# Patient Record
Sex: Female | Born: 1940 | Race: Black or African American | Hispanic: No | Smoking: Current every day smoker
Health system: Southern US, Community
[De-identification: ages and names within clinical notes are randomized; demographics above are authoritative.]

## PROBLEM LIST (undated history)

## (undated) DIAGNOSIS — F329 Major depressive disorder, single episode, unspecified: Secondary | ICD-10-CM

## (undated) DIAGNOSIS — Z87440 Personal history of urinary (tract) infections: Secondary | ICD-10-CM

## (undated) DIAGNOSIS — E785 Hyperlipidemia, unspecified: Secondary | ICD-10-CM

## (undated) DIAGNOSIS — F32A Depression, unspecified: Secondary | ICD-10-CM

## (undated) DIAGNOSIS — F509 Eating disorder, unspecified: Secondary | ICD-10-CM

## (undated) DIAGNOSIS — N950 Postmenopausal bleeding: Secondary | ICD-10-CM

## (undated) DIAGNOSIS — B019 Varicella without complication: Secondary | ICD-10-CM

## (undated) DIAGNOSIS — M1990 Unspecified osteoarthritis, unspecified site: Secondary | ICD-10-CM

## (undated) DIAGNOSIS — R519 Headache, unspecified: Secondary | ICD-10-CM

## (undated) DIAGNOSIS — G43909 Migraine, unspecified, not intractable, without status migrainosus: Secondary | ICD-10-CM

## (undated) DIAGNOSIS — I10 Essential (primary) hypertension: Secondary | ICD-10-CM

## (undated) DIAGNOSIS — K5792 Diverticulitis of intestine, part unspecified, without perforation or abscess without bleeding: Secondary | ICD-10-CM

## (undated) DIAGNOSIS — R51 Headache: Secondary | ICD-10-CM

## (undated) DIAGNOSIS — D649 Anemia, unspecified: Secondary | ICD-10-CM

## (undated) HISTORY — DX: Eating disorder, unspecified: F50.9

## (undated) HISTORY — DX: Personal history of urinary (tract) infections: Z87.440

## (undated) HISTORY — PX: ABDOMINAL HYSTERECTOMY: SHX81

## (undated) HISTORY — DX: Unspecified osteoarthritis, unspecified site: M19.90

## (undated) HISTORY — PX: OTHER SURGICAL HISTORY: SHX169

## (undated) HISTORY — DX: Varicella without complication: B01.9

## (undated) HISTORY — DX: Headache: R51

## (undated) HISTORY — DX: Headache, unspecified: R51.9

## (undated) HISTORY — PX: COLONOSCOPY: SHX174

## (undated) HISTORY — PX: EYE SURGERY: SHX253

## (undated) HISTORY — DX: Migraine, unspecified, not intractable, without status migrainosus: G43.909

## (undated) HISTORY — DX: Diverticulitis of intestine, part unspecified, without perforation or abscess without bleeding: K57.92

---

## 2012-06-19 ENCOUNTER — Other Ambulatory Visit (HOSPITAL_COMMUNITY): Payer: Self-pay | Admitting: Pulmonary Disease

## 2012-06-19 DIAGNOSIS — R319 Hematuria, unspecified: Secondary | ICD-10-CM

## 2012-06-26 ENCOUNTER — Ambulatory Visit (HOSPITAL_COMMUNITY)
Admission: RE | Admit: 2012-06-26 | Discharge: 2012-06-26 | Disposition: A | Discharge status: Home / Self Care | Payer: Medicare Other | Source: Ambulatory Visit | Attending: Pulmonary Disease | Admitting: Pulmonary Disease

## 2012-06-26 DIAGNOSIS — R319 Hematuria, unspecified: Secondary | ICD-10-CM | POA: Insufficient documentation

## 2013-08-12 ENCOUNTER — Other Ambulatory Visit: Payer: Self-pay | Admitting: Pulmonary Disease

## 2013-08-12 DIAGNOSIS — Z1231 Encounter for screening mammogram for malignant neoplasm of breast: Secondary | ICD-10-CM

## 2013-08-18 ENCOUNTER — Other Ambulatory Visit: Payer: Self-pay | Admitting: Pulmonary Disease

## 2013-08-18 DIAGNOSIS — Z1231 Encounter for screening mammogram for malignant neoplasm of breast: Secondary | ICD-10-CM

## 2013-08-19 ENCOUNTER — Ambulatory Visit
Admission: RE | Admit: 2013-08-19 | Discharge: 2013-08-19 | Disposition: A | Discharge status: Home / Self Care | Payer: Medicare Other | Source: Ambulatory Visit | Attending: Pulmonary Disease | Admitting: Pulmonary Disease

## 2013-08-19 ENCOUNTER — Encounter (INDEPENDENT_AMBULATORY_CARE_PROVIDER_SITE_OTHER): Payer: Self-pay

## 2013-08-19 ENCOUNTER — Ambulatory Visit: Payer: Self-pay

## 2013-08-19 DIAGNOSIS — Z1231 Encounter for screening mammogram for malignant neoplasm of breast: Secondary | ICD-10-CM

## 2014-04-24 ENCOUNTER — Other Ambulatory Visit: Payer: Self-pay | Admitting: Obstetrics

## 2014-04-24 NOTE — Progress Notes (Signed)
Spoke Dr. Royce Macadamia, and he is aware of the refusal.  Ricard Dillon, RN

## 2014-04-28 ENCOUNTER — Ambulatory Visit (HOSPITAL_COMMUNITY): Payer: Medicare Other | Admitting: Anesthesiology

## 2014-04-28 ENCOUNTER — Ambulatory Visit (HOSPITAL_COMMUNITY)
Admission: RE | Admit: 2014-04-28 | Discharge: 2014-04-28 | Disposition: A | Discharge status: Home / Self Care | Payer: Medicare Other | Source: Ambulatory Visit | Attending: Obstetrics | Admitting: Obstetrics

## 2014-04-28 ENCOUNTER — Encounter (HOSPITAL_COMMUNITY)
Admission: RE | Disposition: A | Discharge status: Home / Self Care | Payer: Self-pay | Source: Ambulatory Visit | Attending: Obstetrics

## 2014-04-28 DIAGNOSIS — Z8049 Family history of malignant neoplasm of other genital organs: Secondary | ICD-10-CM | POA: Insufficient documentation

## 2014-04-28 DIAGNOSIS — F17200 Nicotine dependence, unspecified, uncomplicated: Secondary | ICD-10-CM | POA: Diagnosis not present

## 2014-04-28 DIAGNOSIS — R938 Abnormal findings on diagnostic imaging of other specified body structures: Secondary | ICD-10-CM | POA: Insufficient documentation

## 2014-04-28 DIAGNOSIS — N858 Other specified noninflammatory disorders of uterus: Secondary | ICD-10-CM | POA: Diagnosis not present

## 2014-04-28 DIAGNOSIS — E669 Obesity, unspecified: Secondary | ICD-10-CM | POA: Insufficient documentation

## 2014-04-28 DIAGNOSIS — Z5309 Procedure and treatment not carried out because of other contraindication: Secondary | ICD-10-CM | POA: Insufficient documentation

## 2014-04-28 DIAGNOSIS — N950 Postmenopausal bleeding: Secondary | ICD-10-CM | POA: Insufficient documentation

## 2014-04-28 DIAGNOSIS — N882 Stricture and stenosis of cervix uteri: Secondary | ICD-10-CM | POA: Diagnosis not present

## 2014-04-28 HISTORY — PX: HYSTEROSCOPY WITH D & C: SHX1775

## 2014-04-28 LAB — URINE MICROSCOPIC-ADD ON

## 2014-04-28 LAB — COMPREHENSIVE METABOLIC PANEL
ALT: 25 U/L (ref 0–35)
ANION GAP: 9 (ref 5–15)
AST: 30 U/L (ref 0–37)
Albumin: 3.9 g/dL (ref 3.5–5.2)
Alkaline Phosphatase: 134 U/L — ABNORMAL HIGH (ref 39–117)
BUN: 17 mg/dL (ref 6–23)
CALCIUM: 9.6 mg/dL (ref 8.4–10.5)
CO2: 25 mmol/L (ref 19–32)
CREATININE: 1.06 mg/dL (ref 0.50–1.10)
Chloride: 108 mmol/L (ref 96–112)
GFR calc non Af Amer: 52 mL/min — ABNORMAL LOW (ref >90)
GFR, EST AFRICAN AMERICAN: 60 mL/min — AB (ref >90)
Glucose, Bld: 111 mg/dL — ABNORMAL HIGH (ref 70–99)
Potassium: 4.1 mmol/L (ref 3.5–5.1)
SODIUM: 142 mmol/L (ref 135–145)
TOTAL PROTEIN: 7.1 g/dL (ref 6.0–8.3)
Total Bilirubin: 0.7 mg/dL (ref 0.3–1.2)

## 2014-04-28 LAB — URINALYSIS, ROUTINE W REFLEX MICROSCOPIC
BILIRUBIN URINE: NEGATIVE
Glucose, UA: NEGATIVE mg/dL
Ketones, ur: NEGATIVE mg/dL
Leukocytes, UA: NEGATIVE
Nitrite: NEGATIVE
PH: 5.5 (ref 5.0–8.0)
Protein, ur: NEGATIVE mg/dL
Specific Gravity, Urine: >1.03 — ABNORMAL HIGH (ref 1.005–1.030)
Urobilinogen, UA: 0.2 mg/dL (ref 0.0–1.0)

## 2014-04-28 LAB — CBC
HCT: 40.9 % (ref 36.0–46.0)
HEMOGLOBIN: 13.6 g/dL (ref 12.0–15.0)
MCH: 30.2 pg (ref 26.0–34.0)
MCHC: 33.3 g/dL (ref 30.0–36.0)
MCV: 90.9 fL (ref 78.0–100.0)
Platelets: 261 10*3/uL (ref 150–400)
RBC: 4.5 MIL/uL (ref 3.87–5.11)
RDW: 14.6 % (ref 11.5–15.5)
WBC: 6.1 10*3/uL (ref 4.0–10.5)

## 2014-04-28 SURGERY — DILATATION AND CURETTAGE /HYSTEROSCOPY
Anesthesia: Choice

## 2014-04-28 MED ORDER — LIDOCAINE HCL (CARDIAC) 20 MG/ML IV SOLN
INTRAVENOUS | Status: AC
  Filled 2014-04-28: qty 5

## 2014-04-28 MED ORDER — FENTANYL CITRATE 0.05 MG/ML IJ SOLN
INTRAMUSCULAR | Status: AC
  Filled 2014-04-28: qty 5

## 2014-04-28 MED ORDER — PROPOFOL 10 MG/ML IV BOLUS
INTRAVENOUS | Status: AC
  Filled 2014-04-28: qty 20

## 2014-04-28 MED ORDER — NEOSTIGMINE METHYLSULFATE 10 MG/10ML IV SOLN
INTRAVENOUS | Status: AC
  Filled 2014-04-28: qty 1

## 2014-04-28 MED ORDER — ONDANSETRON HCL 4 MG/2ML IJ SOLN
INTRAMUSCULAR | Status: AC
  Filled 2014-04-28: qty 2

## 2014-04-28 MED ORDER — LACTATED RINGERS IV SOLN: INTRAVENOUS | Status: DC

## 2014-04-28 MED ORDER — GLYCOPYRROLATE 0.2 MG/ML IJ SOLN
INTRAMUSCULAR | Status: AC
  Filled 2014-04-28: qty 3

## 2014-04-28 MED ORDER — KETOROLAC TROMETHAMINE 30 MG/ML IJ SOLN
INTRAMUSCULAR | Status: AC
  Filled 2014-04-28: qty 1

## 2014-04-28 SURGICAL SUPPLY — 18 items
CANISTER SUCT 3000ML (MISCELLANEOUS) IMPLANT
CATH ROBINSON RED A/P 16FR (CATHETERS) IMPLANT
CLOTH BEACON ORANGE TIMEOUT ST (SAFETY) IMPLANT
CONTAINER PREFILL 10% NBF 60ML (FORM) IMPLANT
ELECT REM PT RETURN 9FT ADLT (ELECTROSURGICAL)
ELECTRODE REM PT RTRN 9FT ADLT (ELECTROSURGICAL) IMPLANT
GLOVE BIO SURGEON STRL SZ 6.5 (GLOVE) IMPLANT
GLOVE BIO SURGEONS STRL SZ 6.5 (GLOVE)
GLOVE BIOGEL PI IND STRL 7.0 (GLOVE) IMPLANT
GLOVE BIOGEL PI INDICATOR 7.0 (GLOVE)
GOWN STRL REUS W/TWL LRG LVL3 (GOWN DISPOSABLE) IMPLANT
LOOP ANGLED CUTTING 22FR (CUTTING LOOP) IMPLANT
PACK VAGINAL MINOR WOMEN LF (CUSTOM PROCEDURE TRAY) IMPLANT
PAD OB MATERNITY 4.3X12.25 (PERSONAL CARE ITEMS) IMPLANT
TOWEL OR 17X24 6PK STRL BLUE (TOWEL DISPOSABLE) IMPLANT
TUBING AQUILEX INFLOW (TUBING) IMPLANT
TUBING AQUILEX OUTFLOW (TUBING) IMPLANT
WATER STERILE IRR 1000ML POUR (IV SOLUTION) IMPLANT

## 2014-04-28 NOTE — OR Nursing (Signed)
Patient informed us she had oatmeal and water . Also informed us she has no ride or anyone to be with her after surgery. Dr Lyndle Herrlich and Dr Pamala Hurry cancelled surgery. Patient advised to reschedule surgery when she can find a ride home and someone to be with her 24hours after surgery. Kristine Royal, RN

## 2014-04-28 NOTE — OR Nursing (Signed)
Several phone calls were made on the patient's behalf. Due to scheduling conflicts and OR schedule patient is not able to have surgery today. She is aware that she will receive a phone call from Dr. Zerita Boers office regarding possible surgery on Friday. Saline lock removed. Transport called for patient home. Pt discharged in NAD and states an understanding that she will be contacted by MD office.

## 2014-04-29 ENCOUNTER — Encounter (HOSPITAL_COMMUNITY): Payer: Self-pay | Admitting: Obstetrics

## 2014-04-29 ENCOUNTER — Other Ambulatory Visit: Payer: Self-pay | Admitting: Obstetrics

## 2014-04-29 NOTE — Patient Instructions (Addendum)
   Your procedure is scheduled on:  Friday, March 11  Enter through the [employer] of [facility] at: 2 PM Pick up the phone at the desk and dial [phone] and inform us of your arrival.  Please call this number if you have any problems the morning of surgery: [phone]  Remember: Do not eat food after midnight: Thursday Do not drink clear liquids after: 11:30 AM Friday, day of surgery Take these medicines the morning of surgery with a sip of water:  metoprolol succinate and atorvastatin  Do not wear jewelry, make-up, or FINGER nail polish No metal in your hair or on your body. Do not wear lotions, powders, perfumes.  You may wear deodorant.  Do not bring valuables to the hospital. Contacts, dentures or bridgework may not be worn into surgery.  For patients being admitted to the hospital, checkout time is 11:00am the day of discharge.  Home with - to be arranged.

## 2014-04-30 ENCOUNTER — Encounter (HOSPITAL_COMMUNITY): Payer: Self-pay

## 2014-04-30 ENCOUNTER — Encounter (HOSPITAL_COMMUNITY)
Admission: RE | Admit: 2014-04-30 | Discharge: 2014-04-30 | Disposition: A | Discharge status: Home / Self Care | Payer: Medicare Other | Source: Ambulatory Visit | Attending: Obstetrics | Admitting: Obstetrics

## 2014-04-30 ENCOUNTER — Other Ambulatory Visit: Payer: Self-pay

## 2014-04-30 DIAGNOSIS — N950 Postmenopausal bleeding: Secondary | ICD-10-CM | POA: Diagnosis not present

## 2014-04-30 DIAGNOSIS — F17210 Nicotine dependence, cigarettes, uncomplicated: Secondary | ICD-10-CM | POA: Diagnosis not present

## 2014-04-30 DIAGNOSIS — Z7982 Long term (current) use of aspirin: Secondary | ICD-10-CM | POA: Diagnosis not present

## 2014-04-30 DIAGNOSIS — Z8049 Family history of malignant neoplasm of other genital organs: Secondary | ICD-10-CM | POA: Diagnosis not present

## 2014-04-30 DIAGNOSIS — E785 Hyperlipidemia, unspecified: Secondary | ICD-10-CM | POA: Diagnosis not present

## 2014-04-30 DIAGNOSIS — F329 Major depressive disorder, single episode, unspecified: Secondary | ICD-10-CM | POA: Diagnosis not present

## 2014-04-30 DIAGNOSIS — I10 Essential (primary) hypertension: Secondary | ICD-10-CM | POA: Diagnosis not present

## 2014-04-30 DIAGNOSIS — Z6831 Body mass index (BMI) 31.0-31.9, adult: Secondary | ICD-10-CM | POA: Diagnosis not present

## 2014-04-30 DIAGNOSIS — E669 Obesity, unspecified: Secondary | ICD-10-CM | POA: Diagnosis not present

## 2014-04-30 HISTORY — DX: Major depressive disorder, single episode, unspecified: F32.9

## 2014-04-30 HISTORY — DX: Essential (primary) hypertension: I10

## 2014-04-30 HISTORY — DX: Hyperlipidemia, unspecified: E78.5

## 2014-04-30 HISTORY — DX: Anemia, unspecified: D64.9

## 2014-04-30 HISTORY — DX: Depression, unspecified: F32.A

## 2014-04-30 NOTE — Pre-Procedure Instructions (Signed)
Dr Royce Macadamia reviewed EKG.  [place] for surgery.  No orders given.

## 2014-05-01 ENCOUNTER — Encounter (HOSPITAL_COMMUNITY)
Admission: RE | Disposition: A | Discharge status: Home / Self Care | Payer: Self-pay | Source: Ambulatory Visit | Attending: Obstetrics

## 2014-05-01 ENCOUNTER — Ambulatory Visit (HOSPITAL_COMMUNITY): Payer: Medicare Other | Admitting: Anesthesiology

## 2014-05-01 ENCOUNTER — Ambulatory Visit (HOSPITAL_COMMUNITY): Payer: Medicare Other

## 2014-05-01 ENCOUNTER — Encounter (HOSPITAL_COMMUNITY): Payer: Self-pay | Admitting: Anesthesiology

## 2014-05-01 ENCOUNTER — Ambulatory Visit (HOSPITAL_COMMUNITY)
Admission: RE | Admit: 2014-05-01 | Discharge: 2014-05-02 | Disposition: A | Discharge status: Home / Self Care | Payer: Medicare Other | Source: Ambulatory Visit | Attending: Obstetrics | Admitting: Obstetrics

## 2014-05-01 DIAGNOSIS — E785 Hyperlipidemia, unspecified: Secondary | ICD-10-CM | POA: Insufficient documentation

## 2014-05-01 DIAGNOSIS — Z7982 Long term (current) use of aspirin: Secondary | ICD-10-CM | POA: Insufficient documentation

## 2014-05-01 DIAGNOSIS — N882 Stricture and stenosis of cervix uteri: Secondary | ICD-10-CM

## 2014-05-01 DIAGNOSIS — Z8049 Family history of malignant neoplasm of other genital organs: Secondary | ICD-10-CM | POA: Insufficient documentation

## 2014-05-01 DIAGNOSIS — N950 Postmenopausal bleeding: Secondary | ICD-10-CM

## 2014-05-01 DIAGNOSIS — Z6831 Body mass index (BMI) 31.0-31.9, adult: Secondary | ICD-10-CM | POA: Insufficient documentation

## 2014-05-01 DIAGNOSIS — I10 Essential (primary) hypertension: Secondary | ICD-10-CM | POA: Insufficient documentation

## 2014-05-01 DIAGNOSIS — F17210 Nicotine dependence, cigarettes, uncomplicated: Secondary | ICD-10-CM | POA: Insufficient documentation

## 2014-05-01 DIAGNOSIS — E669 Obesity, unspecified: Secondary | ICD-10-CM | POA: Insufficient documentation

## 2014-05-01 DIAGNOSIS — F329 Major depressive disorder, single episode, unspecified: Secondary | ICD-10-CM | POA: Diagnosis not present

## 2014-05-01 HISTORY — PX: HYSTEROSCOPY WITH D & C: SHX1775

## 2014-05-01 HISTORY — DX: Postmenopausal bleeding: N95.0

## 2014-05-01 SURGERY — DILATATION AND CURETTAGE /HYSTEROSCOPY
Anesthesia: General | Site: Vagina

## 2014-05-01 MED ORDER — PROPOFOL 10 MG/ML IV BOLUS
INTRAVENOUS | Status: AC
  Filled 2014-05-01: qty 20

## 2014-05-01 MED ORDER — LIDOCAINE HCL (CARDIAC) 20 MG/ML IV SOLN
INTRAVENOUS | Status: AC
  Filled 2014-05-01: qty 5

## 2014-05-01 MED ORDER — CHLOROPROCAINE HCL 1 % IJ SOLN
INTRAMUSCULAR | Status: AC
  Filled 2014-05-01: qty 30

## 2014-05-01 MED ORDER — VASOPRESSIN 20 UNIT/ML IV SOLN
INTRAVENOUS | Status: DC | PRN
Start: 2014-05-01 — End: 2014-05-01
  Administered 2014-05-01: .6 [IU]

## 2014-05-01 MED ORDER — ACETAMINOPHEN 160 MG/5ML PO SOLN: 325.0000 mg | ORAL | Status: DC | PRN

## 2014-05-01 MED ORDER — PROPOFOL 10 MG/ML IV BOLUS
INTRAVENOUS | Status: DC | PRN
  Administered 2014-05-01: 100 mg via INTRAVENOUS
  Administered 2014-05-01: 20 mg via INTRAVENOUS
  Administered 2014-05-01: 50 mg via INTRAVENOUS

## 2014-05-01 MED ORDER — ATORVASTATIN CALCIUM 40 MG PO TABS
40.0000 mg | ORAL_TABLET | Freq: Every day | ORAL | Status: DC
  Filled 2014-05-01: qty 1

## 2014-05-01 MED ORDER — PROMETHAZINE HCL 25 MG/ML IJ SOLN: 6.2500 mg | INTRAMUSCULAR | Status: DC | PRN

## 2014-05-01 MED ORDER — SILVER NITRATE-POT NITRATE 75-25 % EX MISC
CUTANEOUS | Status: AC
  Filled 2014-05-01: qty 1

## 2014-05-01 MED ORDER — ONDANSETRON HCL 4 MG/2ML IJ SOLN
INTRAMUSCULAR | Status: DC | PRN
Start: 2014-05-01 — End: 2014-05-01
  Administered 2014-05-01: 4 mg via INTRAVENOUS

## 2014-05-01 MED ORDER — MIDAZOLAM HCL 2 MG/2ML IJ SOLN
INTRAMUSCULAR | Status: AC
  Filled 2014-05-01: qty 2

## 2014-05-01 MED ORDER — OXYCODONE-ACETAMINOPHEN 5-325 MG PO TABS
2.0000 | ORAL_TABLET | ORAL | Status: DC | PRN
Start: 2014-05-01 — End: 2014-05-02

## 2014-05-01 MED ORDER — ACETAMINOPHEN 325 MG PO TABS: 325.0000 mg | ORAL_TABLET | ORAL | Status: DC | PRN

## 2014-05-01 MED ORDER — VASOPRESSIN 20 UNIT/ML IV SOLN
INTRAVENOUS | Status: AC
  Filled 2014-05-01: qty 1

## 2014-05-01 MED ORDER — SODIUM CHLORIDE 0.9 % IR SOLN
Status: DC | PRN
  Administered 2014-05-01: 3000 mL

## 2014-05-01 MED ORDER — FENTANYL CITRATE 0.05 MG/ML IJ SOLN: 25.0000 ug | INTRAMUSCULAR | Status: DC | PRN

## 2014-05-01 MED ORDER — ONDANSETRON HCL 4 MG/2ML IJ SOLN
INTRAMUSCULAR | Status: AC
  Filled 2014-05-01: qty 2

## 2014-05-01 MED ORDER — BUPIVACAINE HCL (PF) 0.25 % IJ SOLN
INTRAMUSCULAR | Status: AC
Start: 2014-05-01 — End: 2014-05-01
  Filled 2014-05-01: qty 30

## 2014-05-01 MED ORDER — LIDOCAINE HCL (CARDIAC) 20 MG/ML IV SOLN
INTRAVENOUS | Status: DC | PRN
  Administered 2014-05-01: 20 mg via INTRAVENOUS
  Administered 2014-05-01: 50 mg via INTRAVENOUS

## 2014-05-01 MED ORDER — IBUPROFEN 600 MG PO TABS
600.0000 mg | ORAL_TABLET | Freq: Four times a day (QID) | ORAL | Status: DC | PRN
  Administered 2014-05-01 – 2014-05-02 (×2): 600 mg via ORAL
  Filled 2014-05-01 (×2): qty 1

## 2014-05-01 MED ORDER — METOPROLOL SUCCINATE ER 50 MG PO TB24
100.0000 mg | ORAL_TABLET | Freq: Every day | ORAL | Status: DC
Start: 2014-05-02 — End: 2014-05-02
  Filled 2014-05-01 (×2): qty 2

## 2014-05-01 MED ORDER — MEPERIDINE HCL 25 MG/ML IJ SOLN: 6.2500 mg | INTRAMUSCULAR | Status: DC | PRN

## 2014-05-01 MED ORDER — FENTANYL CITRATE 0.05 MG/ML IJ SOLN
INTRAMUSCULAR | Status: AC
  Filled 2014-05-01: qty 2

## 2014-05-01 MED ORDER — FENTANYL CITRATE 0.05 MG/ML IJ SOLN
INTRAMUSCULAR | Status: DC | PRN
  Administered 2014-05-01 (×4): 25 ug via INTRAVENOUS
  Administered 2014-05-01: 50 ug via INTRAVENOUS

## 2014-05-01 MED ORDER — LACTATED RINGERS IV SOLN
INTRAVENOUS | Status: DC
  Administered 2014-05-01 (×2): via INTRAVENOUS

## 2014-05-01 MED ORDER — BUPIVACAINE HCL (PF) 0.25 % IJ SOLN
INTRAMUSCULAR | Status: DC | PRN
  Administered 2014-05-01: 20 mL

## 2014-05-01 SURGICAL SUPPLY — 24 items
CANISTER SUCT 3000ML (MISCELLANEOUS) ×3 IMPLANT
CATH ROBINSON RED A/P 16FR (CATHETERS) ×3 IMPLANT
CLOTH BEACON ORANGE TIMEOUT ST (SAFETY) ×3 IMPLANT
CONTAINER PREFILL 10% NBF 60ML (FORM) ×6 IMPLANT
DECANTER SPIKE VIAL GLASS SM (MISCELLANEOUS) ×3 IMPLANT
DILATOR CANAL MILEX (MISCELLANEOUS) ×3 IMPLANT
ELECT REM PT RETURN 9FT ADLT (ELECTROSURGICAL)
ELECTRODE REM PT RTRN 9FT ADLT (ELECTROSURGICAL) IMPLANT
GLOVE BIO SURGEON STRL SZ 6.5 (GLOVE) ×2 IMPLANT
GLOVE BIO SURGEONS STRL SZ 6.5 (GLOVE) ×1
GLOVE BIOGEL PI IND STRL 7.0 (GLOVE) ×5 IMPLANT
GLOVE BIOGEL PI INDICATOR 7.0 (GLOVE) ×10
GOWN STRL REUS W/TWL LRG LVL3 (GOWN DISPOSABLE) ×6 IMPLANT
IV NS IRRIG 3000ML ARTHROMATIC (IV SOLUTION) ×3 IMPLANT
LOOP ANGLED CUTTING 22FR (CUTTING LOOP) IMPLANT
NEEDLE HYPO 18GX1.5 BLUNT FILL (NEEDLE) ×3 IMPLANT
PACK VAGINAL MINOR WOMEN LF (CUSTOM PROCEDURE TRAY) ×3 IMPLANT
PAD OB MATERNITY 4.3X12.25 (PERSONAL CARE ITEMS) ×3 IMPLANT
SLEEVE SCD COMPRESS KNEE MED (MISCELLANEOUS) ×3 IMPLANT
SYR TB 1ML LUER SLIP (SYRINGE) ×3 IMPLANT
TOWEL OR 17X24 6PK STRL BLUE (TOWEL DISPOSABLE) ×6 IMPLANT
TUBING AQUILEX INFLOW (TUBING) ×3 IMPLANT
TUBING AQUILEX OUTFLOW (TUBING) ×3 IMPLANT
WATER STERILE IRR 1000ML POUR (IV SOLUTION) ×3 IMPLANT

## 2014-05-01 NOTE — Op Note (Signed)
05/01/2014  4:16 PM  PATIENT:  Candace Crawford  74 y.o. female  PRE-OPERATIVE DIAGNOSIS:  Endometrial Thickening, Postmenopausal Bleeding  POST-OPERATIVE DIAGNOSIS:  Endometrial Thickening, Postmenopausal Bleeding, uterine perforation  PROCEDURE:  Procedure(s): CERVICAL DILATION ,HYSTEROSCOPY WITH UTERINE PERFORATION (N/A)  Ultrasound guidance  SURGEON:  Surgeon(s) and Role:    * Aloha Gell, MD - Primary  PHYSICIAN ASSISTANT: none  ASSISTANTS: none   ANESTHESIA:   local and general  EBL:  Total I/O In: 1000 [I.V.:1000] Out: -   BLOOD ADMINISTERED:none  DRAINS: none   LOCAL MEDICATIONS USED:  OTHER 1/4% Marcaine, 30 cc mixed with 60 units of vasopressin. Total of 20 cc of this mixture was used  SPECIMEN:  No Specimen  DISPOSITION OF SPECIMEN:  N/A  COUNTS:  YES  TOURNIQUET:  * No tourniquets in log *  DICTATION: .Note written in EPIC  PLAN OF CARE: Admit for overnight observation  PATIENT DISPOSITION:  PACU - hemodynamically stable.   Delay start of Pharmacological VTE agent (>24hrs) due to surgical blood loss or risk of bleeding: yes  Antibiotics: None complications:  Uterine perforation Findings: Free fluid I ultrasound postprocedure secondary to uterine perforation, 8 cm size uterus with 1.5 cm fundal thickening, cervical stenosis, hemostasis post procedure  Indications: 74 year old G0 with obesity, smoker, family history of endometrial cancer who presented with postmenopausal bleeding and was found to have a 1.5 cm thickened endometrial stripe. Unable to complete endometrial biopsy in the office on 2 attempts and decision for attempts in the OR under anesthesia.  Procedure: After informed consent was obtained from the patient including risk benefits of the procedure to include perforation pot and possible need for expiratory laparotomy and laparoscopy, patient was taken to the operating room where general anesthesia was initiated without difficulty she is  prepped and draped in the normal sterile fashion in the dorsal supine lithotomy position. A straight catheter was done. Bimanual examination was done to assess the size and position of the uterus. Speculum was inserted into the vagina and 10 cc of the above anesthetic mixture with vasopressin was injected at 5 and 7:00 in the cervical paracervical junction. Tenaculum was used to grasp the anterior lip of the cervix. Attempt was made to dilate the cervical canal. First with a 13 mm Haney dilator though this was unsuccessful. The small hysteroscope was then used to assess the cervical canal and for attempted visual guided cervical dilation. This too did not lead to entry into the endometrial space. Smaller dilators were then used but resistance was again encountered. The small hysteroscope was again used for attempted visual dilation following the presumed cervical canal. During this time close attention was made to the hysteroscopic fluid deficit. The fluid deficit began to climb quite rapidly consistent with uterine perforation. Visually it did not appear that we were in the endometrial canal. Instruments were removed ultrasound guidance was then asked for. Once he ultrasound was up and running peritoneal free fluid was noted consistent with uterine perforation. No bleeding was noted vaginally and patient remained hemodynamically stable. Attempt was made again to dilate the cervical canal under ultrasound guidance. By ultrasound images it appeared the dilators were entering the proper endometrial space though palpably this did not appear to be the case. At each attempt to confirm the proper dilation that was seen by ultrasound with the hysteroscope images were not consistent with endometrial canal and fluid deficit began to rise rapidly. After about 15 minutes of attempted ultrasound dilation without visually or palpably being in  the right space the decision was made to terminate the procedure. Patient remained  hematoma dynamically stable no vaginal bleeding was noted. All instruments were removed. Sponge lap needle counts were correct 3 and patient was taken to the recovery room  Hysteroscopic deficit was with saline fluid was 1300 cc.  No curettage was done.   Priti Consoli A. 05/01/2014 4:26 PM

## 2014-05-01 NOTE — Anesthesia Postprocedure Evaluation (Signed)
  Anesthesia Post Note  Patient: Candace Crawford  Procedure(s) Performed: Procedure(s) (LRB): CERVICAL DILATION ,HYSTEROSCOPY WITH UTERINE PERFORATION (N/A)  Anesthesia type: GA  Patient location: PACU  Post pain: Pain level controlled  Post assessment: Post-op Vital signs reviewed  Last Vitals:  Filed Vitals:   05/01/14 1715  BP:   Pulse:   Temp:   Resp: 12    Post vital signs: Reviewed  Level of consciousness: sedated  Complications: No apparent anesthesia complications

## 2014-05-01 NOTE — H&P (Signed)
CC: for D&C, hysteroscopy for post-menopausal bleeding  HPI: 74 yo G0 who presented with "my menses has come back" after 20 yrs of menopause. U/s showed 16 mm thick endometrial stripe with complexity and vascular flow. Uterus 8x5x4 cm. 4 cm elongated fluid collection adjeacent to R ovary, Nl R ovary, nl L ovaryw/ 6 mm calcification. Given her risks factors (obesity, smoker, mom w/ endometrial cancer) ebx was attempted but unsucccessful in office due to cervical stenosis. Repeat attempt w/ u/s guided D&C after cytotec was also unsuccessful due to pt's tolerance of exam. Decision made to proceed for OR D&C, hysteroscopy.  Past Medical History  Diagnosis Date  . Hypertension   . Hyperlipidemia   . Depression     no meds  . Anemia     Past Surgical History  Procedure Laterality Date  . Hysteroscopy w/d&c N/A 04/28/2014    surgery cancelled, patient ate, reschedule for 05/01/14  . Bilateral foot surgery      bunions  . Colonoscopy      Meds: atorvostatin, ASA, metoprolol  All: PCN  FH: mom w/ endometrial cancer SH: smoker, 2 younger sisters live in [place], no family support in [place], moved to [place] to be with a friend in [place] who is now very ill  PE: Filed Vitals:   05/01/14 1416  BP: 143/71  Pulse: 74  Temp: 98.2 F (36.8 C)  Resp: 16   Gen: no distress CV (in office): RRR Pulm (in office) CTAB Abd: soft, gravid, NT GU: atrophic, no dominant masses, bloody vag d/c, stenotic cvx  A/P: Post-menopausal bleeding with thickened endometrium and risk factors for endometrial cancer.  - R/B of D&C, hysteroscopy d/w pt. She is aware surgical risks, including perforation, need for l'scope and xlap with complications.  - plan overnight observation as pt w/o social support  Cap Massi A. 05/01/2014 3:01 PM

## 2014-05-01 NOTE — Transfer of Care (Signed)
Immediate Anesthesia Transfer of Care Note  Patient: Candace Crawford  Procedure(s) Performed: Procedure(s): CERVICAL DILATION ,HYSTEROSCOPY WITH UTERINE PERFORATION (N/A)  Patient Location: PACU  Anesthesia Type:General  Level of Consciousness: awake and patient cooperative  Airway & Oxygen Therapy: Patient Spontanous Breathing and Patient connected to nasal cannula oxygen  Post-op Assessment: Report given to RN and Post -op Vital signs reviewed and stable  Post vital signs: Reviewed and stable  Last Vitals:  Filed Vitals:   05/01/14 1416  BP: 143/71  Pulse: 74  Temp: 36.8 C  Resp: 16    Complications: No apparent anesthesia complications

## 2014-05-01 NOTE — Brief Op Note (Signed)
05/01/2014  4:16 PM  PATIENT:  Candace Crawford  74 y.o. female  PRE-OPERATIVE DIAGNOSIS:  Endometrial Thickening, Postmenopausal Bleeding  POST-OPERATIVE DIAGNOSIS:  Endometrial Thickening, Postmenopausal Bleeding, uterine perforation  PROCEDURE:  Procedure(s): CERVICAL DILATION ,HYSTEROSCOPY WITH UTERINE PERFORATION (N/A)  Ultrasound guidance  SURGEON:  Surgeon(s) and Role:    * Aloha Gell, MD - Primary  PHYSICIAN ASSISTANT: none  ASSISTANTS: none   ANESTHESIA:   local and general  EBL:  Total I/O In: 1000 [I.V.:1000] Out: -   BLOOD ADMINISTERED:none  DRAINS: none   LOCAL MEDICATIONS USED:  OTHER 1/4% Marcaine, 30 cc mixed with 60 units of vasopressin. Total of 20 cc of this mixture was used  SPECIMEN:  No Specimen  DISPOSITION OF SPECIMEN:  N/A  COUNTS:  YES  TOURNIQUET:  * No tourniquets in log *  DICTATION: .Note written in EPIC  PLAN OF CARE: Admit for overnight observation  PATIENT DISPOSITION:  PACU - hemodynamically stable.   Delay start of Pharmacological VTE agent (>24hrs) due to surgical blood loss or risk of bleeding: yes  Torien Ramroop A. 05/01/2014 4:18 PM

## 2014-05-01 NOTE — Anesthesia Preprocedure Evaluation (Signed)
Anesthesia Evaluation  Patient identified by MRN, date of birth, ID band Patient awake    Reviewed: Allergy & Precautions, H&P , Patient's Chart, lab work & pertinent test results, reviewed documented beta blocker date and time   History of Anesthesia Complications Negative for: history of anesthetic complications  Airway Mallampati: II  TM Distance: >3 FB Neck ROM: full    Dental   Pulmonary Current Smoker,  breath sounds clear to auscultation        Cardiovascular Exercise Tolerance: Good hypertension, Rhythm:regular Rate:Normal     Neuro/Psych PSYCHIATRIC DISORDERS negative psych ROS   GI/Hepatic   Endo/Other    Renal/GU      Musculoskeletal   Abdominal   Peds  Hematology   Anesthesia Other Findings   Reproductive/Obstetrics                             Anesthesia Physical Anesthesia Plan  ASA: II  Anesthesia Plan: General LMA   Post-op Pain Management:    Induction:   Airway Management Planned:   Additional Equipment:   Intra-op Plan:   Post-operative Plan:   Informed Consent: I have reviewed the patients History and Physical, chart, labs and discussed the procedure including the risks, benefits and alternatives for the proposed anesthesia with the patient or authorized representative who has indicated his/her understanding and acceptance.   Dental Advisory Given  Plan Discussed with: CRNA, Surgeon and Anesthesiologist  Anesthesia Plan Comments:         Anesthesia Quick Evaluation

## 2014-05-02 DIAGNOSIS — N950 Postmenopausal bleeding: Secondary | ICD-10-CM | POA: Diagnosis not present

## 2014-05-02 LAB — CBC
HCT: 35.1 % — ABNORMAL LOW (ref 36.0–46.0)
HCT: 39.7 % (ref 36.0–46.0)
HCT: 39.3 % (ref 36.0–46.0)
HEMOGLOBIN: 11.6 g/dL — AB (ref 12.0–15.0)
HEMOGLOBIN: 12.6 g/dL (ref 12.0–15.0)
Hemoglobin: 12.9 g/dL (ref 12.0–15.0)
MCH: 30.1 pg (ref 26.0–34.0)
MCH: 29.9 pg (ref 26.0–34.0)
MCH: 29.4 pg (ref 26.0–34.0)
MCHC: 33 g/dL (ref 30.0–36.0)
MCHC: 32.5 g/dL (ref 30.0–36.0)
MCHC: 32.1 g/dL (ref 30.0–36.0)
MCV: 91.2 fL (ref 78.0–100.0)
MCV: 91.9 fL (ref 78.0–100.0)
MCV: 91.8 fL (ref 78.0–100.0)
PLATELETS: 215 10*3/uL (ref 150–400)
PLATELETS: 231 10*3/uL (ref 150–400)
Platelets: 234 10*3/uL (ref 150–400)
RBC: 3.85 MIL/uL — ABNORMAL LOW (ref 3.87–5.11)
RBC: 4.32 MIL/uL (ref 3.87–5.11)
RBC: 4.28 MIL/uL (ref 3.87–5.11)
RDW: 14.8 % (ref 11.5–15.5)
RDW: 15.1 % (ref 11.5–15.5)
RDW: 14.9 % (ref 11.5–15.5)
WBC: 5 10*3/uL (ref 4.0–10.5)
WBC: 5.5 10*3/uL (ref 4.0–10.5)
WBC: 5.8 10*3/uL (ref 4.0–10.5)

## 2014-05-02 LAB — COMPREHENSIVE METABOLIC PANEL
ALBUMIN: 3.5 g/dL (ref 3.5–5.2)
ALK PHOS: 116 U/L (ref 39–117)
ALT: 17 U/L (ref 0–35)
ALT: 20 U/L (ref 0–35)
ALT: 23 U/L (ref 0–35)
ANION GAP: 6 (ref 5–15)
ANION GAP: 8 (ref 5–15)
AST: 21 U/L (ref 0–37)
AST: 24 U/L (ref 0–37)
AST: 25 U/L (ref 0–37)
Albumin: 2.9 g/dL — ABNORMAL LOW (ref 3.5–5.2)
Albumin: 3.4 g/dL — ABNORMAL LOW (ref 3.5–5.2)
Alkaline Phosphatase: 97 U/L (ref 39–117)
Alkaline Phosphatase: 116 U/L (ref 39–117)
Anion gap: 8 (ref 5–15)
BUN: 12 mg/dL (ref 6–23)
BUN: 12 mg/dL (ref 6–23)
BUN: 13 mg/dL (ref 6–23)
CALCIUM: 8.8 mg/dL (ref 8.4–10.5)
CO2: 28 mmol/L (ref 19–32)
CO2: 28 mmol/L (ref 19–32)
CO2: 27 mmol/L (ref 19–32)
CREATININE: 1.12 mg/dL — AB (ref 0.50–1.10)
Calcium: 8.3 mg/dL — ABNORMAL LOW (ref 8.4–10.5)
Calcium: 9.2 mg/dL (ref 8.4–10.5)
Chloride: 107 mmol/L (ref 96–112)
Chloride: 107 mmol/L (ref 96–112)
Chloride: 106 mmol/L (ref 96–112)
Creatinine, Ser: 1.2 mg/dL — ABNORMAL HIGH (ref 0.50–1.10)
Creatinine, Ser: 1.05 mg/dL (ref 0.50–1.10)
GFR calc Af Amer: 56 mL/min — ABNORMAL LOW (ref >90)
GFR calc Af Amer: 51 mL/min — ABNORMAL LOW (ref >90)
GFR calc Af Amer: 60 mL/min — ABNORMAL LOW (ref >90)
GFR calc non Af Amer: 48 mL/min — ABNORMAL LOW (ref >90)
GFR calc non Af Amer: 44 mL/min — ABNORMAL LOW (ref >90)
GFR calc non Af Amer: 52 mL/min — ABNORMAL LOW (ref >90)
GLUCOSE: 117 mg/dL — AB (ref 70–99)
Glucose, Bld: 85 mg/dL (ref 70–99)
Glucose, Bld: 95 mg/dL (ref 70–99)
POTASSIUM: 3.9 mmol/L (ref 3.5–5.1)
Potassium: 4 mmol/L (ref 3.5–5.1)
Potassium: 3.8 mmol/L (ref 3.5–5.1)
SODIUM: 141 mmol/L (ref 135–145)
Sodium: 141 mmol/L (ref 135–145)
Sodium: 143 mmol/L (ref 135–145)
TOTAL PROTEIN: 5.7 g/dL — AB (ref 6.0–8.3)
TOTAL PROTEIN: 6.7 g/dL (ref 6.0–8.3)
Total Bilirubin: 0.8 mg/dL (ref 0.3–1.2)
Total Bilirubin: 0.8 mg/dL (ref 0.3–1.2)
Total Bilirubin: 0.5 mg/dL (ref 0.3–1.2)
Total Protein: 7.1 g/dL (ref 6.0–8.3)

## 2014-05-02 MED ORDER — IBUPROFEN 600 MG PO TABS: 600.0000 mg | ORAL_TABLET | Freq: Four times a day (QID) | ORAL | Status: DC | PRN

## 2014-05-02 MED ORDER — MENTHOL 3 MG MT LOZG
1.0000 | LOZENGE | OROMUCOSAL | Status: DC | PRN
  Administered 2014-05-02: 3 mg via ORAL
  Filled 2014-05-02: qty 9

## 2014-05-02 MED ORDER — MENTHOL 3 MG MT LOZG: 1.0000 | LOZENGE | OROMUCOSAL | Status: DC | PRN

## 2014-05-02 NOTE — Progress Notes (Signed)

## 2014-05-02 NOTE — Anesthesia Postprocedure Evaluation (Signed)
Anesthesia Post Note  Patient: Candace Crawford  Procedure(s) Performed: Procedure(s): DILATATION AND CURETTAGE /HYSTEROSCOPY (N/A)  Anesthesia type: General  Patient location: [facility]  Post pain: Pain level controlled  Post assessment: Post-op Vital signs reviewed  Last Vitals: BP 112/73 mmHg  Pulse 74  Temp(Src) 36.6 C (Oral)  Resp 16  Ht 5\' 4"  (1.626 m)  Wt 185 lb (83.915 kg)  BMI 31.74 kg/m2  SpO2 97%  Post vital signs: Reviewed  Level of consciousness: awake  Complications: No apparent anesthesia complications

## 2014-05-02 NOTE — Discharge Summary (Signed)
Candace Crawford MRN: [id number] DOB/AGE: 1941/01/18 74 y.o.  Admit date: 05/01/2014 Discharge date: 05/02/14  Admission Diagnoses: Endometrial Thickening, Postmenopausal Bleeding  Discharge Diagnoses: Endometrial Thickening, Postmenopausal Bleeding        Principal Problem:   Postmenopausal bleeding   Discharged Condition: stable  Hospital Course: Pt admitted for [facility] and hysteroscopy, see operative note. Uterine perforation and unable to sample endometrium. 1300cc NS fluid deficit. Pt observed overnight. NO bleeding, good appetite, + flatus, + void, ambulating without difficulty.  On POD#1 pt alert and cooperative, no distress, Abd: soft, NT, ND. LE: TED hose in place, no edema, GU: no VB  ON POD #1, Hgb dropped more than expected and Cr rose slightly, recheck 6 hrs later showed worsening of Cr and improvement in Hgb. Given pt's history of chronic kidney disease, decision was made to recheck again in 6 hrs, at which time labs improved. Pt remained asymptomatic with stable vitals and desire to be discharged. Surgical complications of uterine perforation and inability to get endometrial sample shared with pt. Planned follow up with oncology for consideration of hysterectomy, with ability to add lymph node dissection if needed shared with pt who agrees with plan.   Consults: None  Treatments: surgery: cervical dilation, hysteroscopy  Disposition: 01-Home or Self Care     Medication List    TAKE these medications        aspirin EC 81 MG tablet  Take 81 mg by mouth daily.     atorvastatin 40 MG tablet  Commonly known as:  LIPITOR  Take 40 mg by mouth daily.     b complex vitamins tablet  Take 1 tablet by mouth daily.     Calcium-Vitamin D3 600-500 MG-UNIT Caps  Take 1 tablet by mouth daily.     ibuprofen 600 MG tablet  Commonly known as:  ADVIL,MOTRIN  Take 1 tablet (600 mg total) by mouth every 6 (six) hours as needed for mild pain.     menthol-cetylpyridinium 3 MG lozenge   Commonly known as:  CEPACOL  Take 1 lozenge (3 mg total) by mouth as needed for sore throat.     metoprolol succinate 100 MG 24 hr tablet  Commonly known as:  TOPROL-XL  Take 100 mg by mouth daily. Take with or immediately following a meal.     multivitamin with minerals Tabs tablet  Take 1 tablet by mouth daily.     SYSTANE OP  Place 1 drop into both eyes daily as needed (dryness).     vitamin C 500 MG tablet  Commonly known as:  ASCORBIC ACID  Take 500 mg by mouth daily.     Vitamin D3 5000 UNITS Tabs  Take 1 tablet by mouth daily.         Signed: Ala Dach., MD 05/02/2014, 9:39 AM

## 2014-05-05 ENCOUNTER — Encounter (HOSPITAL_COMMUNITY): Payer: Self-pay | Admitting: Obstetrics

## 2014-05-15 ENCOUNTER — Ambulatory Visit: Payer: Medicare Other | Admitting: Gynecologic Oncology

## 2014-05-25 ENCOUNTER — Ambulatory Visit: Payer: Medicare Other | Admitting: Gynecologic Oncology

## 2014-05-25 ENCOUNTER — Telehealth: Payer: Self-pay | Admitting: *Deleted

## 2014-05-25 NOTE — Telephone Encounter (Signed)
Received a call from [facility] @ Dr. Jerrilyn Cairo office to cancel pt's appointment. Pt is nervous and would like to speak with Dr. Pamala Hurry again before being seen by GYN Oncology.

## 2014-05-29 ENCOUNTER — Ambulatory Visit: Payer: Medicare Other | Attending: Gynecologic Oncology | Admitting: Gynecologic Oncology

## 2014-05-29 ENCOUNTER — Encounter: Payer: Self-pay | Admitting: Gynecologic Oncology

## 2014-05-29 ENCOUNTER — Ambulatory Visit: Payer: Medicare Other | Admitting: Gynecologic Oncology

## 2014-05-29 VITALS — BP 147/58 | HR 64 | Temp 98.4°F | Resp 18 | Ht 65.0 in | Wt 185.8 lb

## 2014-05-29 DIAGNOSIS — Z8049 Family history of malignant neoplasm of other genital organs: Secondary | ICD-10-CM

## 2014-05-29 DIAGNOSIS — N950 Postmenopausal bleeding: Secondary | ICD-10-CM | POA: Diagnosis not present

## 2014-05-29 DIAGNOSIS — N882 Stricture and stenosis of cervix uteri: Secondary | ICD-10-CM | POA: Diagnosis not present

## 2014-05-29 DIAGNOSIS — R938 Abnormal findings on diagnostic imaging of other specified body structures: Secondary | ICD-10-CM

## 2014-05-29 NOTE — Progress Notes (Signed)
Consult Note: Gyn-Onc  Consult was requested by Dr. Pamala Hurry for the evaluation of Candace Crawford 74 y.o. female with postmenopausal bleeding and a thickened cystic endometrial stripe concerning for occult malignancy.  CC:  Chief Complaint  Patient presents with  . PMB    Assessment/Plan:  [name]  is a 74 y.o.  year old woman with postmenopausal bleeding and a thickened endometrial stripe a 16 mm with multiple failed attempts in the office and operating room to achieve sampling of the endometrium secondary to cervical stenosis.  I agree with Dr. Jerrilyn Cairo concern for potential occult underlying malignancy given the ultrasound findings of a cystic complex thickened endometrium with abnormal flow on ultrasound in addition to postmenopausal bleeding and risk factors of a nulliparous older woman. I believe that definitive pathologic evaluation is necessary and appears the only way to feasibly do this is through hysterectomy. Frozen section can then be performed at the time of surgery and if malignancy is confirmed staging would follow with lymphadenectomy. I discussed with the patient the potential risk for an occult cervical cancer causing cervical stenosis in which case the next fascial hysterectomy might result an increased need for the requirement of post operative radiation. However given her lack of history vaginal Pap smears, in addition to her ultrasound findings of endometrial abnormalities I believe the probable etiology of this bleeding is endometrial and endocervical in origin.  I believe she is a good candidate for a robotic-assisted total hysterectomy with BSO. I discussed operative risks with the patient including  bleeding, infection, damage to internal organs (such as bladder,ureters, bowels), blood clot, reoperation and rehospitalization. I also discussed risks associated with notable musculoskeletal injury with positioning under anesthesia and risks associated with  lymphadenectomy including lymphedema and lymphocyst formation. I discussed anticipated postoperative recovery and hospital duration (overnight).    HPI: Candace Crawford  is a 74 year old gravida 0 who is seen in consultation at the request of Dr. Pamala Hurry for postmenopausal bleeding, and a thickened endometrial stripe. The patient reports a single episode of postmenopausal bleeding  earlier in 2016. She saw Dr. Pamala Hurry if every 2016 who attempted endometrial biopsy in the office however this was unsuccessful secondary to cervical stenosis. She then returned in March 2016 with Cytotec operation ultrasound-guided attempted endometrial biopsy and this too was unsuccessful secondary to cervical stenosis. An ultrasound scan was done to confirm the need for an endometrial biopsy and this revealed a 16 mm thickened complex endometrium with flowed noted. He was taken to the operating room on March 11 for an attempted dilation and curettage under ultrasound guidance. Once again substantial cervical stenosis was encountered. The uterus measured 8 cm on intraoperative ultrasound scan. After uterine perforation was confirmed the procedure was aborted.   Of note the patient's mother has a history of uterine cancer at a proximally age 57. The patient is nulliparous. She lives alone and is in excellent general health.   Current Meds:  Outpatient Encounter Prescriptions as of 05/29/2014  Medication Sig  . aspirin EC 81 MG tablet Take 81 mg by mouth daily.  Marland Kitchen atorvastatin (LIPITOR) 40 MG tablet Take 40 mg by mouth daily.  Marland Kitchen b complex vitamins tablet Take 1 tablet by mouth daily.  . metoprolol (LOPRESSOR) 100 MG tablet   . Multiple Vitamin (MULTIVITAMIN WITH MINERALS) TABS tablet Take 1 tablet by mouth daily.  Vladimir Faster Glycol-Propyl Glycol (SYSTANE OP) Place 1 drop into both eyes daily as needed (dryness).  . vitamin C (ASCORBIC ACID) 500 MG  tablet Take 500 mg by mouth daily.  . Calcium Carb-Cholecalciferol  (CALCIUM-VITAMIN D3) 600-500 MG-UNIT CAPS Take 1 tablet by mouth daily.   . Cholecalciferol (VITAMIN D3) 5000 UNITS TABS Take 1 tablet by mouth daily.  Marland Kitchen ibuprofen (ADVIL,MOTRIN) 600 MG tablet Take 1 tablet (600 mg total) by mouth every 6 (six) hours as needed for mild pain. (Patient not taking: Reported on 05/29/2014)  . menthol-cetylpyridinium (CEPACOL) 3 MG lozenge Take 1 lozenge (3 mg total) by mouth as needed for sore throat. (Patient not taking: Reported on 05/29/2014)  . metoprolol succinate (TOPROL-XL) 100 MG 24 hr tablet Take 100 mg by mouth daily. Take with or immediately following a meal.    Allergy:  Allergies  Allergen Reactions  . Sulfa Antibiotics Other (See Comments)    Unknown reaction but patient was told not to take these drugs  . Penicillins Rash    Social Hx:   History   Social History  . Marital Status: Widowed    Spouse Name: N/A  . Number of Children: N/A  . Years of Education: N/A   Occupational History  . Not on file.   Social History Main Topics  . Smoking status: Current Every Day Smoker -- 0.50 packs/day    Types: Cigarettes  . Smokeless tobacco: Never Used  . Alcohol Use: Yes     Comment: wine twice week  . Drug Use: No  . Sexual Activity: Yes    Birth Control/ Protection: Post-menopausal   Other Topics Concern  . Not on file   Social History Narrative    Past Surgical Hx:  Past Surgical History  Procedure Laterality Date  . Hysteroscopy w/d&c N/A 04/28/2014    surgery cancelled, patient ate, reschedule for 05/01/14  . Bilateral foot surgery      bunions  . Colonoscopy    . Hysteroscopy w/d&c N/A 05/01/2014    Procedure: CERVICAL DILATION ,HYSTEROSCOPY WITH UTERINE PERFORATION;  Surgeon: Aloha Gell, MD;  Location: [facility];  Service: Gynecology;  Laterality: N/A;    Past Medical Hx:  Past Medical History  Diagnosis Date  . Hypertension   . Hyperlipidemia   . Depression     no meds  . Anemia   . Postmenopausal bleeding 05/01/2014     Past Gynecological History:  G0. No prior abn paps.  No LMP recorded. Patient is postmenopausal.  Family Hx: History reviewed. No pertinent family history.  Review of Systems:  Constitutional  Feels well,    ENT Normal appearing ears and nares bilaterally Skin/Breast  No rash, sores, jaundice, itching, dryness Cardiovascular  No chest pain, shortness of breath, or edema  Pulmonary  No cough or wheeze.  Gastro Intestinal  No nausea, vomitting, or diarrhoea. No bright red blood per rectum, no abdominal pain, change in bowel movement, or constipation.  Genito Urinary  No frequency, urgency, dysuria, see HPI Musculo Skeletal  No myalgia, arthralgia, joint swelling or pain  Neurologic  No weakness, numbness, change in gait,  Psychology  No depression, anxiety, insomnia.   Vitals:  Blood pressure 147/58, pulse 64, temperature 98.4 F (36.9 C), temperature source Oral, resp. rate 18, height 5\' 5"  (1.651 m), weight 185 lb 12.8 oz (84.278 kg).  Physical Exam: WD in NAD Neck  Supple NROM, without any enlargements.  Lymph Node Survey No cervical supraclavicular or inguinal adenopathy Cardiovascular  Pulse normal rate, regularity and rhythm. S1 and S2 normal.  Lungs  Clear to auscultation bilateraly, without wheezes/crackles/rhonchi. Good air movement.  Skin  No  rash/lesions/breakdown  Psychiatry  Alert and oriented to person, place, and time  Abdomen  Normoactive bowel sounds, abdomen soft, non-tender and overweight without evidence of hernia.  Back No CVA tenderness Genito Urinary  Vulva/vagina: Normal external female genitalia.  No lesions. No discharge or bleeding.  Bladder/urethra:  No lesions or masses, well supported bladder  Vagina: normal, no lesions  Cervix: Normal appearing, no lesions.  Uterus: Small, mobile, no parametrial involvement or nodularity.  Adnexa: no palpable masses. Rectal  Good tone, no masses no cul de sac nodularity.  Extremities  No  bilateral cyanosis, clubbing or edema.   Donaciano Eva, MD  05/29/2014, 2:03 PM

## 2014-05-29 NOTE — Patient Instructions (Signed)
Preparing for your Surgery  Plan for surgery on Jul 16, 2014 with Dr. Denman George.  Pre-operative Testing -You will receive a phone call from presurgical testing at [facility] to arrange for a pre-operative testing appointment before your surgery.  This appointment normally occurs one to two weeks before your scheduled surgery.   -Bring your insurance card, copy of an advanced directive if applicable, medication list  -At that visit, you will be asked to sign a consent for a possible blood transfusion in case a transfusion becomes necessary during surgery.  The need for a blood transfusion is rare but having consent is a necessary part of your care.     -You should not be taking blood thinners or aspirin at least ten days prior to surgery unless instructed by your surgeon.  Day Before Surgery at [place] will be asked to take in only clear liquids the day before surgery.  Examples of clear liquids include broths, jello, and clear juices.  You will be advised to have nothing to eat or drink after midnight the evening before.    Your role in recovery Your role is to become active as soon as directed by your doctor, while still giving yourself time to heal.  Rest when you feel tired. You will be asked to do the following in order to speed your recovery:  - Cough and breathe deeply. This helps toclear and expand your lungs and can prevent pneumonia. You may be given a spirometer to practice deep breathing. A staff member will show you how to use the spirometer. - Do mild physical activity. Walking or moving your legs help your circulation and body functions return to normal. A staff member will help you when you try to walk and will provide you with simple exercises. Do not try to get up or walk alone the first time. - Actively manage your pain. Managing your pain lets you move in comfort. We will ask you to rate your pain on a scale of zero to 10. It is your responsibility to  tell your doctor or nurse where and how much you hurt so your pain can be treated.  Special Considerations -If you are diabetic, you may be placed on insulin after surgery to have closer control over your blood sugars to promote healing and recovery.  This does not mean that you will be discharged on insulin.  If applicable, your oral antidiabetics will be resumed when you are tolerating a solid diet.  -Your final pathology results from surgery should be available by the Friday after surgery and the results will be relayed to you when available.  Blood Transfusion Information WHAT IS A BLOOD TRANSFUSION? A transfusion is the replacement of blood or some of its parts. Blood is made up of multiple cells which provide different functions.  Red blood cells carry oxygen and are used for blood loss replacement.  White blood cells fight against infection.  Platelets control bleeding.  Plasma helps clot blood.  Other blood products are available for specialized needs, such as hemophilia or other clotting disorders. BEFORE THE TRANSFUSION  Who gives blood for transfusions?   You may be able to donate blood to be used at a later date on yourself (autologous donation).  Relatives can be asked to donate blood. This is generally not any safer than if you have received blood from a stranger. The same precautions are taken to ensure safety when a relative's blood is donated.  Healthy volunteers who are  fully evaluated to make sure their blood is safe. This is blood bank blood. Transfusion therapy is the safest it has ever been in the practice of medicine. Before blood is taken from a donor, a complete history is taken to make sure that person has no history of diseases nor engages in risky social behavior (examples are intravenous drug use or sexual activity with multiple partners). The donor's travel history is screened to minimize risk of transmitting infections, such as malaria. The donated blood is  tested for signs of infectious diseases, such as HIV and hepatitis. The blood is then tested to be sure it is compatible with you in order to minimize the chance of a transfusion reaction. If you or a relative donates blood, this is often done in anticipation of surgery and is not appropriate for emergency situations. It takes many days to process the donated blood. RISKS AND COMPLICATIONS Although transfusion therapy is very safe and saves many lives, the main dangers of transfusion include:   Getting an infectious disease.  Developing a transfusion reaction. This is an allergic reaction to something in the blood you were given. Every precaution is taken to prevent this. The decision to have a blood transfusion has been considered carefully by your caregiver before blood is given. Blood is not given unless the benefits outweigh the risks.

## 2014-07-07 ENCOUNTER — Telehealth: Payer: Self-pay | Admitting: Gynecologic Oncology

## 2014-07-07 NOTE — Telephone Encounter (Signed)
Patient called stating she is receiving multiple bills from Dr. Jerrilyn Cairo office for a procedure she had with her where her uterus was perforated.  She states she feels she should not have to pay the bills.  Stating she will have to postpone her surgery with Dr. Denman George on May 26 if she cannot get the bills straightened out.  Advised she would need to speak with a representative in billing at Dr. Jerrilyn Cairo office.  Advised to call our office for any further concerns and to let us know if she needs to post-pone her surgery.

## 2014-07-10 NOTE — Patient Instructions (Signed)
Tiani Stanbery  07/10/2014   Your procedure is scheduled on:    07/16/2014    Report to [facility] Main  Entrance and follow signs to               [place] at      0830 AM.  Call this number if you have problems the morning of surgery [phone]   Remember: ONLY 1 PERSON MAY GO WITH YOU TO SHORT STAY TO GET  READY MORNING OF [place]. Clear liquid diet beginning on Wednesday , 07/15/2014 am then nothing to eat or drink after midnite .       Take these medicines the morning of surgery with A SIP OF WATER:   Metoprolol ( Lopressor), Systane eye drops                                You may not have any metal on your body including hair pins and              piercings  Do not wear jewelry, make-up, lotions, powders or perfumes, deodorant             Do not wear nail polish.  Do not shave  48 hours prior to surgery.                Do not bring valuables to the hospital. [place].  Contacts, dentures or bridgework may not be worn into surgery.  Leave suitcase in the car. After surgery it may be brought to your room.         Special Instructions: coughing and deep breathing exercises, leg exercises               Please read over the following fact sheets you were given: _____________________________________________________________________             [facility] - Preparing for Surgery Before surgery, you can play an important role.  Because skin is not sterile, your skin needs to be as free of germs as possible.  You can reduce the number of germs on your skin by washing with CHG (chlorahexidine gluconate) soap before surgery.  CHG is an antiseptic cleaner which kills germs and bonds with the skin to continue killing germs even after washing. Please DO NOT use if you have an allergy to CHG or antibacterial soaps.  If your skin becomes reddened/irritated stop using the CHG and inform your nurse when  you arrive at Short Stay. Do not shave (including legs and underarms) for at least 48 hours prior to the first CHG shower.  You may shave your face/neck. Please follow these instructions carefully:  1.  Shower with CHG Soap the night before surgery and the  morning of Surgery.  2.  If you choose to wash your hair, wash your hair first as usual with your  normal  shampoo.  3.  After you shampoo, rinse your hair and body thoroughly to remove the  shampoo.                           4.  Use CHG as you would any other liquid soap.  You can apply chg directly  to the skin and wash                       Gently with a scrungie or clean washcloth.  5.  Apply the CHG Soap to your body ONLY FROM THE NECK DOWN.   Do not use on face/ open                           Wound or open sores. Avoid contact with eyes, ears mouth and genitals (private parts).                       Wash face,  Genitals (private parts) with your normal soap.             6.  Wash thoroughly, paying special attention to the area where your surgery  will be performed.  7.  Thoroughly rinse your body with warm water from the neck down.  8.  DO NOT shower/wash with your normal soap after using and rinsing off  the CHG Soap.                9.  Pat yourself dry with a clean towel.            10.  Wear clean pajamas.            11.  Place clean sheets on your bed the night of your first shower and do not  sleep with pets. Day of Surgery : Do not apply any lotions/deodorants the morning of surgery.  Please wear clean clothes to the hospital/surgery center.  FAILURE TO FOLLOW THESE INSTRUCTIONS MAY RESULT IN THE CANCELLATION OF YOUR SURGERY PATIENT SIGNATURE_________________________________  NURSE SIGNATURE__________________________________  ________________________________________________________________________  WHAT IS A BLOOD TRANSFUSION? Blood Transfusion Information  A transfusion is the replacement of blood or some of its parts.  Blood is made up of multiple cells which provide different functions.  Red blood cells carry oxygen and are used for blood loss replacement.  White blood cells fight against infection.  Platelets control bleeding.  Plasma helps clot blood.  Other blood products are available for specialized needs, such as hemophilia or other clotting disorders. BEFORE THE TRANSFUSION  Who gives blood for transfusions?   Healthy volunteers who are fully evaluated to make sure their blood is safe. This is blood bank blood. Transfusion therapy is the safest it has ever been in the practice of medicine. Before blood is taken from a donor, a complete history is taken to make sure that person has no history of diseases nor engages in risky social behavior (examples are intravenous drug use or sexual activity with multiple partners). The donor's travel history is screened to minimize risk of transmitting infections, such as malaria. The donated blood is tested for signs of infectious diseases, such as HIV and hepatitis. The blood is then tested to be sure it is compatible with you in order to minimize the chance of a transfusion reaction. If you or a relative donates blood, this is often done in anticipation of surgery and is not appropriate for emergency situations. It takes many days to process the donated blood. RISKS AND COMPLICATIONS Although transfusion therapy is very safe and saves many lives, the main dangers of transfusion include:  1. Getting an infectious disease. 2. Developing a transfusion reaction. This is an allergic reaction to something in the blood you were given. Every precaution is taken to prevent  this. The decision to have a blood transfusion has been considered carefully by your caregiver before blood is given. Blood is not given unless the benefits outweigh the risks. AFTER THE TRANSFUSION  Right after receiving a blood transfusion, you will usually feel much better and more energetic. This  is especially true if your red blood cells have gotten low (anemic). The transfusion raises the level of the red blood cells which carry oxygen, and this usually causes an energy increase.  The nurse administering the transfusion will monitor you carefully for complications. HOME CARE INSTRUCTIONS  No special instructions are needed after a transfusion. You may find your energy is better. Speak with your caregiver about any limitations on activity for underlying diseases you may have. SEEK MEDICAL CARE IF:   Your condition is not improving after your transfusion.  You develop redness or irritation at the intravenous (IV) site. SEEK IMMEDIATE MEDICAL CARE IF:  Any of the following symptoms occur over the next 12 hours:  Shaking chills.  You have a temperature by mouth above 102 F (38.9 C), not controlled by medicine.  Chest, back, or muscle pain.  People around you feel you are not acting correctly or are confused.  Shortness of breath or difficulty breathing.  Dizziness and fainting.  You get a rash or develop hives.  You have a decrease in urine output.  Your urine turns a dark color or changes to pink, red, or brown. Any of the following symptoms occur over the next 10 days:  You have a temperature by mouth above 102 F (38.9 C), not controlled by medicine.  Shortness of breath.  Weakness after normal activity.  The white part of the eye turns yellow (jaundice).  You have a decrease in the amount of urine or are urinating less often.  Your urine turns a dark color or changes to pink, red, or brown. Document Released: 02/04/2000 Document Revised: 05/01/2011 Document Reviewed: 09/23/2007 ExitCare Patient Information 2014 [place].  _______________________________________________________________________  Incentive Spirometer  An incentive spirometer is a tool that can help keep your lungs clear and active. This tool measures how well you are filling your  lungs with each breath. Taking long deep breaths may help reverse or decrease the chance of developing breathing (pulmonary) problems (especially infection) following:  A long period of time when you are unable to move or be active. BEFORE THE PROCEDURE   If the spirometer includes an indicator to show your best effort, your nurse or respiratory therapist will set it to a desired goal.  If possible, sit up straight or lean slightly forward. Try not to slouch.  Hold the incentive spirometer in an upright position. INSTRUCTIONS FOR USE  3. Sit on the edge of your bed if possible, or sit up as far as you can in bed or on a chair. 4. Hold the incentive spirometer in an upright position. 5. Breathe out normally. 6. Place the mouthpiece in your mouth and seal your lips tightly around it. 7. Breathe in slowly and as deeply as possible, raising the piston or the ball toward the top of the column. 8. Hold your breath for 3-5 seconds or for as long as possible. Allow the piston or ball to fall to the bottom of the column. 9. Remove the mouthpiece from your mouth and breathe out normally. 10. Rest for a few seconds and repeat Steps 1 through 7 at least 10 times every 1-2 hours when you are awake. Take your time and take  a few normal breaths between deep breaths. 11. The spirometer may include an indicator to show your best effort. Use the indicator as a goal to work toward during each repetition. 12. After each set of 10 deep breaths, practice coughing to be sure your lungs are clear. If you have an incision (the cut made at the time of surgery), support your incision when coughing by placing a pillow or rolled up towels firmly against it. Once you are able to get out of bed, walk around indoors and cough well. You may stop using the incentive spirometer when instructed by your caregiver.  RISKS AND COMPLICATIONS  Take your time so you do not get dizzy or light-headed.  If you are in pain, you may  need to take or ask for pain medication before doing incentive spirometry. It is harder to take a deep breath if you are having pain. AFTER USE  Rest and breathe slowly and easily.  It can be helpful to keep track of a log of your progress. Your caregiver can provide you with a simple table to help with this. If you are using the spirometer at home, follow these instructions: [place] IF:   You are having difficultly using the spirometer.  You have trouble using the spirometer as often as instructed.  Your pain medication is not giving enough relief while using the spirometer.  You develop fever of 100.5 F (38.1 C) or higher. SEEK IMMEDIATE MEDICAL CARE IF:   You cough up bloody sputum that had not been present before.  You develop fever of 102 F (38.9 C) or greater.  You develop worsening pain at or near the incision site. MAKE SURE YOU:   Understand these instructions.  Will watch your condition.  Will get help right away if you are not doing well or get worse. Document Released: 06/19/2006 Document Revised: 05/01/2011 Document Reviewed: 08/20/2006 ExitCare Patient Information 2014 ExitCare, [place].   ________________________________________________________________________    CLEAR LIQUID DIET   Foods Allowed                                                                     Foods Excluded  Coffee and tea, regular and decaf                             liquids that you cannot  Plain Jell-O in any flavor                                             see through such as: Fruit ices (not with fruit pulp)                                     milk, soups, orange juice  Iced Popsicles                                    All solid food Carbonated beverages, regular and diet  Cranberry, grape and apple juices Sports drinks like Gatorade Lightly seasoned clear broth or consume(fat free) Sugar, honey syrup  Sample Menu Breakfast                                 Lunch                                     Supper Cranberry juice                    Beef broth                            Chicken broth Jell-O                                     Grape juice                           Apple juice Coffee or tea                        Jell-O                                      Popsicle                                                Coffee or tea                        Coffee or tea  _____________________________________________________________________

## 2014-07-13 ENCOUNTER — Inpatient Hospital Stay (HOSPITAL_COMMUNITY)
Admission: RE | Admit: 2014-07-13 | Discharge: 2014-07-13 | Disposition: A | Discharge status: Home / Self Care | Payer: Medicare Other | Source: Ambulatory Visit

## 2014-07-13 NOTE — Progress Notes (Signed)
ekg 3/16 epic

## 2014-07-13 NOTE — Patient Instructions (Signed)
Your procedure is scheduled on:07/16/14  THURSDAY    Report to [facility] at   0830    AM.   Call this number if you have problems the morning of surgery: [phone]      BEGIN CLEAR LIQUID DIET TODAY AS PER OFFICE  Do not  TAKE ANYTHING BY MOUTH AFTER MIDNIGHT TONIGHT  Take these medicines the morning of surgery with A SIP OF WATER: METOPROLOL   .  Contacts, dentures or partial plates, or metal hairpins  can not be worn to surgery. Your family will be responsible for glasses, dentures, hearing aides while you are in surgery  Leave suitcase in the car. After surgery it may be brought to your room.  For patients admitted to the hospital, checkout time is 11:00 AM day of  discharge.         [facility] IS NOT RESPONSIBLE FOR ANY VALUABLES  Patients discharged the day of surgery will not be allowed to drive home. IF going home the day of surgery, you must have a driver and someone to stay with you for the first 24 hours                                                                                                                                        [facility] - Preparing for Surgery Before surgery, you can play an important role.  Because skin is not sterile, your skin needs to be as free of germs as possible.  You can reduce the number of germs on your skin by washing with CHG (chlorahexidine gluconate) soap before surgery.  CHG is an antiseptic cleaner which kills germs and bonds with the skin to continue killing germs even after washing. Please DO NOT use if you have an allergy to CHG or antibacterial soaps.  If your skin becomes reddened/irritated stop using the CHG and inform your nurse when you arrive at Short Stay. Do not shave (including legs and underarms) for at least 48 hours prior to the first CHG shower.  You may shave your face/neck. Please follow these instructions carefully:  1.  Shower  with CHG Soap the night before surgery and the  morning of Surgery.  2.  If you choose to wash your hair, wash your hair first as usual with your  normal  shampoo.  3.  After you shampoo, rinse your hair and body thoroughly to remove the  shampoo.                           4.  Use CHG as you would any other liquid soap.  You can apply chg directly  to the skin and wash  Gently with a scrungie or clean washcloth.  5.  Apply the CHG Soap to your body ONLY FROM THE NECK DOWN.   Do not use on face/ open                           Wound or open sores. Avoid contact with eyes, ears mouth and genitals (private parts).                       Wash face,  Genitals (private parts) with your normal soap.             6.  Wash thoroughly, paying special attention to the area where your surgery  will be performed.  7.  Thoroughly rinse your body with warm water from the neck down.  8.  DO NOT shower/wash with your normal soap after using and rinsing off  the CHG Soap.                9.  Pat yourself dry with a clean towel.            10.  Wear clean pajamas.            11.  Place clean sheets on your bed the night of your first shower and do not  sleep with pets. Day of Surgery : Do not apply any lotions/deodorants the morning of surgery.  Please wear clean clothes to the hospital/surgery center.  FAILURE TO FOLLOW THESE INSTRUCTIONS MAY RESULT IN THE CANCELLATION OF YOUR SURGERY PATIENT SIGNATURE_________________________________  NURSE SIGNATURE__________________________________  ________________________________________________________________________    CLEAR LIQUID DIET   Foods Allowed                                                                     Foods Excluded  Coffee and tea, regular and decaf                             liquids that you cannot  Plain Jell-O in any flavor                                             see through such as: Fruit ices (not with fruit pulp)                                      milk, soups, orange juice  Iced Popsicles                                    All solid food Carbonated beverages, regular and diet                                    Cranberry, grape and apple juices Sports drinks like Gatorade Lightly seasoned clear broth or consume(fat free) Sugar, honey syrup  Sample Menu Breakfast                                Lunch                                     Supper Cranberry juice                    Beef broth                            Chicken broth Jell-O                                     Grape juice                           Apple juice Coffee or tea                        Jell-O                                      Popsicle                                                Coffee or tea                        Coffee or tea  _____________________________________________________________________   WHAT IS A BLOOD TRANSFUSION? Blood Transfusion Information  A transfusion is the replacement of blood or some of its parts. Blood is made up of multiple cells which provide different functions.  Red blood cells carry oxygen and are used for blood loss replacement.  White blood cells fight against infection.  Platelets control bleeding.  Plasma helps clot blood.  Other blood products are available for specialized needs, such as hemophilia or other clotting disorders. BEFORE THE TRANSFUSION  Who gives blood for transfusions?   Healthy volunteers who are fully evaluated to make sure their blood is safe. This is blood bank blood. Transfusion therapy is the safest it has ever been in the practice of medicine. Before blood is taken from a donor, a complete history is taken to make sure that person has no history of diseases nor engages in risky social behavior (examples are intravenous drug use or sexual activity with multiple partners). The donor's travel history is screened to minimize risk of transmitting infections, such as  malaria. The donated blood is tested for signs of infectious diseases, such as HIV and hepatitis. The blood is then tested to be sure it is compatible with you in order to minimize the chance of a transfusion reaction. If you or a relative donates blood, this is often done in anticipation of surgery and is not appropriate for emergency situations. It takes many days to process the donated blood. RISKS AND COMPLICATIONS Although transfusion therapy is very safe and saves many lives, the main dangers of transfusion include:  1. Getting an infectious disease. 2. Developing a transfusion reaction. This is an allergic reaction to something in the blood you were given. Every precaution is taken to prevent this. The decision to have a blood transfusion has been considered carefully by your caregiver before blood is given. Blood is not given unless the benefits outweigh the risks. AFTER THE TRANSFUSION  Right after receiving a blood transfusion, you will usually feel much better and more energetic. This is especially true if your red blood cells have gotten low (anemic). The transfusion raises the level of the red blood cells which carry oxygen, and this usually causes an energy increase.  The nurse administering the transfusion will monitor you carefully for complications. HOME CARE INSTRUCTIONS  No special instructions are needed after a transfusion. You may find your energy is better. Speak with your caregiver about any limitations on activity for underlying diseases you may have. SEEK MEDICAL CARE IF:   Your condition is not improving after your transfusion.  You develop redness or irritation at the intravenous (IV) site. SEEK IMMEDIATE MEDICAL CARE IF:  Any of the following symptoms occur over the next 12 hours:  Shaking chills.  You have a temperature by mouth above 102 F (38.9 C), not controlled by medicine.  Chest, back, or muscle pain.  People around you feel you are not acting  correctly or are confused.  Shortness of breath or difficulty breathing.  Dizziness and fainting.  You get a rash or develop hives.  You have a decrease in urine output.  Your urine turns a dark color or changes to pink, red, or brown. Any of the following symptoms occur over the next 10 days:  You have a temperature by mouth above 102 F (38.9 C), not controlled by medicine.  Shortness of breath.  Weakness after normal activity.  The white part of the eye turns yellow (jaundice).  You have a decrease in the amount of urine or are urinating less often.  Your urine turns a dark color or changes to pink, red, or brown. Document Released: 02/04/2000 Document Revised: 05/01/2011 Document Reviewed: 09/23/2007 ExitCare Patient Information 2014 [place].  _______________________________________________________________________  Incentive Spirometer  An incentive spirometer is a tool that can help keep your lungs clear and active. This tool measures how well you are filling your lungs with each breath. Taking long deep breaths may help reverse or decrease the chance of developing breathing (pulmonary) problems (especially infection) following:  A long period of time when you are unable to move or be active. BEFORE THE PROCEDURE   If the spirometer includes an indicator to show your best effort, your nurse or respiratory therapist will set it to a desired goal.  If possible, sit up straight or lean slightly forward. Try not to slouch.  Hold the incentive spirometer in an upright position. INSTRUCTIONS FOR USE  3. Sit on the edge of your bed if possible, or sit up as far as you can in bed or on a chair. 4. Hold the incentive spirometer in an upright position. 5. Breathe out normally. 6. Place the mouthpiece in your mouth and seal your lips tightly around it. 7. Breathe in slowly and as deeply as possible, raising the piston or the ball toward the top of the column. 8. Hold  your breath for 3-5 seconds or for as long as possible. Allow the piston or ball to fall to the bottom of the column. 9. Remove the mouthpiece from your mouth and breathe out  normally. 10. Rest for a few seconds and repeat Steps 1 through 7 at least 10 times every 1-2 hours when you are awake. Take your time and take a few normal breaths between deep breaths. 11. The spirometer may include an indicator to show your best effort. Use the indicator as a goal to work toward during each repetition. 12. After each set of 10 deep breaths, practice coughing to be sure your lungs are clear. If you have an incision (the cut made at the time of surgery), support your incision when coughing by placing a pillow or rolled up towels firmly against it. Once you are able to get out of bed, walk around indoors and cough well. You may stop using the incentive spirometer when instructed by your caregiver.  RISKS AND COMPLICATIONS  Take your time so you do not get dizzy or light-headed.  If you are in pain, you may need to take or ask for pain medication before doing incentive spirometry. It is harder to take a deep breath if you are having pain. AFTER USE  Rest and breathe slowly and easily.  It can be helpful to keep track of a log of your progress. Your caregiver can provide you with a simple table to help with this. If you are using the spirometer at home, follow these instructions: [place] IF:   You are having difficultly using the spirometer.  You have trouble using the spirometer as often as instructed.  Your pain medication is not giving enough relief while using the spirometer.  You develop fever of 100.5 F (38.1 C) or higher. SEEK IMMEDIATE MEDICAL CARE IF:   You cough up bloody sputum that had not been present before.  You develop fever of 102 F (38.9 C) or greater.  You develop worsening pain at or near the incision site. MAKE SURE YOU:   Understand these instructions.  Will  watch your condition.  Will get help right away if you are not doing well or get worse. Document Released: 06/19/2006 Document Revised: 05/01/2011 Document Reviewed: 08/20/2006 [facility] Patient Information 2014 [address], [place].   ________________________________________________________________________

## 2014-07-15 ENCOUNTER — Encounter (HOSPITAL_COMMUNITY)
Admission: RE | Admit: 2014-07-15 | Discharge: 2014-07-15 | Disposition: A | Discharge status: Home / Self Care | Payer: Medicare Other | Source: Ambulatory Visit | Attending: Gynecologic Oncology | Admitting: Gynecologic Oncology

## 2014-07-15 ENCOUNTER — Encounter (HOSPITAL_COMMUNITY): Payer: Self-pay

## 2014-07-15 DIAGNOSIS — F329 Major depressive disorder, single episode, unspecified: Secondary | ICD-10-CM | POA: Diagnosis not present

## 2014-07-15 DIAGNOSIS — N840 Polyp of corpus uteri: Secondary | ICD-10-CM | POA: Diagnosis not present

## 2014-07-15 DIAGNOSIS — I10 Essential (primary) hypertension: Secondary | ICD-10-CM | POA: Diagnosis not present

## 2014-07-15 DIAGNOSIS — Z90710 Acquired absence of both cervix and uterus: Secondary | ICD-10-CM | POA: Diagnosis not present

## 2014-07-15 DIAGNOSIS — N882 Stricture and stenosis of cervix uteri: Secondary | ICD-10-CM | POA: Diagnosis not present

## 2014-07-15 DIAGNOSIS — F17210 Nicotine dependence, cigarettes, uncomplicated: Secondary | ICD-10-CM | POA: Diagnosis not present

## 2014-07-15 DIAGNOSIS — N950 Postmenopausal bleeding: Secondary | ICD-10-CM | POA: Diagnosis not present

## 2014-07-15 DIAGNOSIS — E785 Hyperlipidemia, unspecified: Secondary | ICD-10-CM | POA: Diagnosis not present

## 2014-07-15 DIAGNOSIS — Z882 Allergy status to sulfonamides status: Secondary | ICD-10-CM | POA: Diagnosis not present

## 2014-07-15 DIAGNOSIS — D259 Leiomyoma of uterus, unspecified: Secondary | ICD-10-CM | POA: Diagnosis not present

## 2014-07-15 DIAGNOSIS — Z7982 Long term (current) use of aspirin: Secondary | ICD-10-CM | POA: Diagnosis not present

## 2014-07-15 DIAGNOSIS — Z880 Allergy status to penicillin: Secondary | ICD-10-CM | POA: Diagnosis not present

## 2014-07-15 LAB — CBC
HCT: 41.2 % (ref 36.0–46.0)
Hemoglobin: 13 g/dL (ref 12.0–15.0)
MCH: 29.3 pg (ref 26.0–34.0)
MCHC: 31.6 g/dL (ref 30.0–36.0)
MCV: 92.8 fL (ref 78.0–100.0)
Platelets: 254 10*3/uL (ref 150–400)
RBC: 4.44 MIL/uL (ref 3.87–5.11)
RDW: 14.8 % (ref 11.5–15.5)
WBC: 4.8 10*3/uL (ref 4.0–10.5)

## 2014-07-15 LAB — ABO/RH: ABO/RH(D): B POS

## 2014-07-15 LAB — COMPREHENSIVE METABOLIC PANEL
ALT: 22 U/L (ref 14–54)
AST: 26 U/L (ref 15–41)
Albumin: 3.6 g/dL (ref 3.5–5.0)
Alkaline Phosphatase: 135 U/L — ABNORMAL HIGH (ref 38–126)
Anion gap: 8 (ref 5–15)
BUN: 20 mg/dL (ref 6–20)
CALCIUM: 8.9 mg/dL (ref 8.9–10.3)
CHLORIDE: 106 mmol/L (ref 101–111)
CO2: 28 mmol/L (ref 22–32)
Creatinine, Ser: 1.06 mg/dL — ABNORMAL HIGH (ref 0.44–1.00)
GFR calc non Af Amer: 51 mL/min — ABNORMAL LOW (ref >60)
GFR, EST AFRICAN AMERICAN: 59 mL/min — AB (ref >60)
GLUCOSE: 88 mg/dL (ref 65–99)
Potassium: 4.1 mmol/L (ref 3.5–5.1)
Sodium: 142 mmol/L (ref 135–145)
TOTAL PROTEIN: 7.2 g/dL (ref 6.5–8.1)
Total Bilirubin: 0.4 mg/dL (ref 0.3–1.2)

## 2014-07-15 LAB — URINALYSIS, ROUTINE W REFLEX MICROSCOPIC
Bilirubin Urine: NEGATIVE
GLUCOSE, UA: NEGATIVE mg/dL
Ketones, ur: NEGATIVE mg/dL
LEUKOCYTES UA: NEGATIVE
NITRITE: NEGATIVE
PROTEIN: NEGATIVE mg/dL
SPECIFIC GRAVITY, URINE: 1.028 (ref 1.005–1.030)
UROBILINOGEN UA: 0.2 mg/dL (ref 0.0–1.0)
pH: 5.5 (ref 5.0–8.0)

## 2014-07-15 LAB — URINE MICROSCOPIC-ADD ON

## 2014-07-15 NOTE — Progress Notes (Signed)
Faxed u/a with [facility] to [facility] cross np via epic and notified her they went to her inbox for review

## 2014-07-16 ENCOUNTER — Ambulatory Visit (HOSPITAL_COMMUNITY): Payer: Medicare Other | Admitting: Registered Nurse

## 2014-07-16 ENCOUNTER — Ambulatory Visit (HOSPITAL_COMMUNITY)
Admission: RE | Admit: 2014-07-16 | Discharge: 2014-07-17 | Disposition: A | Discharge status: Home / Self Care | Payer: Medicare Other | Source: Ambulatory Visit | Attending: Gynecologic Oncology | Admitting: Gynecologic Oncology

## 2014-07-16 ENCOUNTER — Encounter (HOSPITAL_COMMUNITY): Payer: Self-pay | Admitting: Registered Nurse

## 2014-07-16 ENCOUNTER — Encounter (HOSPITAL_COMMUNITY)
Admission: RE | Disposition: A | Discharge status: Home / Self Care | Payer: Self-pay | Source: Ambulatory Visit | Attending: Gynecologic Oncology

## 2014-07-16 DIAGNOSIS — N882 Stricture and stenosis of cervix uteri: Secondary | ICD-10-CM | POA: Diagnosis not present

## 2014-07-16 DIAGNOSIS — N840 Polyp of corpus uteri: Secondary | ICD-10-CM | POA: Insufficient documentation

## 2014-07-16 DIAGNOSIS — N950 Postmenopausal bleeding: Secondary | ICD-10-CM | POA: Diagnosis not present

## 2014-07-16 DIAGNOSIS — Z90710 Acquired absence of both cervix and uterus: Secondary | ICD-10-CM | POA: Insufficient documentation

## 2014-07-16 DIAGNOSIS — D259 Leiomyoma of uterus, unspecified: Secondary | ICD-10-CM | POA: Diagnosis not present

## 2014-07-16 DIAGNOSIS — Z7982 Long term (current) use of aspirin: Secondary | ICD-10-CM | POA: Insufficient documentation

## 2014-07-16 DIAGNOSIS — Z880 Allergy status to penicillin: Secondary | ICD-10-CM | POA: Insufficient documentation

## 2014-07-16 DIAGNOSIS — F17210 Nicotine dependence, cigarettes, uncomplicated: Secondary | ICD-10-CM | POA: Insufficient documentation

## 2014-07-16 DIAGNOSIS — F329 Major depressive disorder, single episode, unspecified: Secondary | ICD-10-CM | POA: Insufficient documentation

## 2014-07-16 DIAGNOSIS — E785 Hyperlipidemia, unspecified: Secondary | ICD-10-CM | POA: Insufficient documentation

## 2014-07-16 DIAGNOSIS — Z882 Allergy status to sulfonamides status: Secondary | ICD-10-CM | POA: Insufficient documentation

## 2014-07-16 DIAGNOSIS — I10 Essential (primary) hypertension: Secondary | ICD-10-CM | POA: Insufficient documentation

## 2014-07-16 HISTORY — PX: ROBOTIC ASSISTED TOTAL HYSTERECTOMY WITH BILATERAL SALPINGO OOPHERECTOMY: SHX6086

## 2014-07-16 LAB — TYPE AND SCREEN
ABO/RH(D): B POS
Antibody Screen: NEGATIVE

## 2014-07-16 SURGERY — HYSTERECTOMY, TOTAL, ROBOT-ASSISTED, LAPAROSCOPIC, WITH BILATERAL SALPINGO-OOPHORECTOMY
Anesthesia: General | Site: Abdomen

## 2014-07-16 MED ORDER — SUGAMMADEX SODIUM 500 MG/5ML IV SOLN
INTRAVENOUS | Status: AC
  Filled 2014-07-16: qty 5

## 2014-07-16 MED ORDER — CIPROFLOXACIN IN D5W 400 MG/200ML IV SOLN
INTRAVENOUS | Status: AC
  Filled 2014-07-16: qty 200

## 2014-07-16 MED ORDER — HYDROMORPHONE HCL 2 MG/ML IJ SOLN
INTRAMUSCULAR | Status: AC
  Filled 2014-07-16: qty 1

## 2014-07-16 MED ORDER — DEXAMETHASONE SODIUM PHOSPHATE 10 MG/ML IJ SOLN
INTRAMUSCULAR | Status: AC
  Filled 2014-07-16: qty 1

## 2014-07-16 MED ORDER — MENTHOL 3 MG MT LOZG
1.0000 | LOZENGE | OROMUCOSAL | Status: DC | PRN
  Administered 2014-07-16: 3 mg via ORAL
  Filled 2014-07-16: qty 9

## 2014-07-16 MED ORDER — ASPIRIN EC 81 MG PO TBEC
81.0000 mg | DELAYED_RELEASE_TABLET | Freq: Every morning | ORAL | Status: DC
  Administered 2014-07-17: 81 mg via ORAL
  Filled 2014-07-16: qty 1

## 2014-07-16 MED ORDER — ADULT MULTIVITAMIN W/MINERALS CH
1.0000 | ORAL_TABLET | Freq: Every morning | ORAL | Status: DC
Start: 2014-07-17 — End: 2014-07-17
  Administered 2014-07-17: 1 via ORAL
  Filled 2014-07-16: qty 1

## 2014-07-16 MED ORDER — KCL IN DEXTROSE-NACL 20-5-0.45 MEQ/L-%-% IV SOLN
INTRAVENOUS | Status: AC
  Filled 2014-07-16: qty 1000

## 2014-07-16 MED ORDER — CLINDAMYCIN PHOSPHATE 900 MG/50ML IV SOLN
900.0000 mg | INTRAVENOUS | Status: AC
  Administered 2014-07-16: 900 mg via INTRAVENOUS

## 2014-07-16 MED ORDER — SUFENTANIL CITRATE 50 MCG/ML IV SOLN
INTRAVENOUS | Status: DC | PRN
Start: 2014-07-16 — End: 2014-07-16
  Administered 2014-07-16 (×5): 10 ug via INTRAVENOUS

## 2014-07-16 MED ORDER — PROPOFOL 10 MG/ML IV BOLUS
INTRAVENOUS | Status: AC
  Filled 2014-07-16: qty 20

## 2014-07-16 MED ORDER — MIDAZOLAM HCL 2 MG/2ML IJ SOLN
INTRAMUSCULAR | Status: AC
  Filled 2014-07-16: qty 2

## 2014-07-16 MED ORDER — ONDANSETRON HCL 4 MG/2ML IJ SOLN
INTRAMUSCULAR | Status: DC | PRN
  Administered 2014-07-16: 4 mg via INTRAVENOUS

## 2014-07-16 MED ORDER — IBUPROFEN 800 MG PO TABS: 800.0000 mg | ORAL_TABLET | Freq: Three times a day (TID) | ORAL | Status: DC | PRN

## 2014-07-16 MED ORDER — CIPROFLOXACIN IN D5W 400 MG/200ML IV SOLN
400.0000 mg | INTRAVENOUS | Status: AC
  Administered 2014-07-16: 400 mg via INTRAVENOUS

## 2014-07-16 MED ORDER — SUGAMMADEX SODIUM 500 MG/5ML IV SOLN
INTRAVENOUS | Status: DC | PRN
  Administered 2014-07-16: 400 mg via INTRAVENOUS

## 2014-07-16 MED ORDER — METOPROLOL TARTRATE 100 MG PO TABS
100.0000 mg | ORAL_TABLET | Freq: Every morning | ORAL | Status: DC
  Administered 2014-07-17: 100 mg via ORAL
  Filled 2014-07-16: qty 1

## 2014-07-16 MED ORDER — MIDAZOLAM HCL 5 MG/5ML IJ SOLN
INTRAMUSCULAR | Status: DC | PRN
  Administered 2014-07-16 (×2): 1 mg via INTRAVENOUS

## 2014-07-16 MED ORDER — HYDROMORPHONE HCL 1 MG/ML IJ SOLN: 0.2500 mg | INTRAMUSCULAR | Status: DC | PRN

## 2014-07-16 MED ORDER — OXYCODONE-ACETAMINOPHEN 5-325 MG PO TABS: 1.0000 | ORAL_TABLET | ORAL | Status: DC | PRN

## 2014-07-16 MED ORDER — ONDANSETRON HCL 4 MG/2ML IJ SOLN
INTRAMUSCULAR | Status: AC
  Filled 2014-07-16: qty 2

## 2014-07-16 MED ORDER — ONDANSETRON HCL 4 MG PO TABS: 4.0000 mg | ORAL_TABLET | Freq: Four times a day (QID) | ORAL | Status: DC | PRN

## 2014-07-16 MED ORDER — HYDROMORPHONE HCL 1 MG/ML IJ SOLN: 0.2000 mg | INTRAMUSCULAR | Status: DC | PRN

## 2014-07-16 MED ORDER — CLINDAMYCIN PHOSPHATE 900 MG/50ML IV SOLN
INTRAVENOUS | Status: AC
  Filled 2014-07-16: qty 50

## 2014-07-16 MED ORDER — LACTATED RINGERS IV SOLN
INTRAVENOUS | Status: DC | PRN
  Administered 2014-07-16 (×2): via INTRAVENOUS

## 2014-07-16 MED ORDER — PROPOFOL 10 MG/ML IV BOLUS
INTRAVENOUS | Status: DC | PRN
  Administered 2014-07-16: 170 mg via INTRAVENOUS

## 2014-07-16 MED ORDER — DEXAMETHASONE SODIUM PHOSPHATE 10 MG/ML IJ SOLN
INTRAMUSCULAR | Status: DC | PRN
  Administered 2014-07-16: 10 mg via INTRAVENOUS

## 2014-07-16 MED ORDER — TEMAZEPAM 7.5 MG PO CAPS: 7.5000 mg | ORAL_CAPSULE | Freq: Every evening | ORAL | Status: DC | PRN

## 2014-07-16 MED ORDER — KCL IN DEXTROSE-NACL 20-5-0.45 MEQ/L-%-% IV SOLN
INTRAVENOUS | Status: DC
  Administered 2014-07-16: 14:00:00 via INTRAVENOUS
  Filled 2014-07-16 (×2): qty 1000

## 2014-07-16 MED ORDER — ONDANSETRON HCL 4 MG/2ML IJ SOLN: 4.0000 mg | Freq: Four times a day (QID) | INTRAMUSCULAR | Status: DC | PRN

## 2014-07-16 MED ORDER — SUFENTANIL CITRATE 50 MCG/ML IV SOLN
INTRAVENOUS | Status: AC
  Filled 2014-07-16: qty 1

## 2014-07-16 MED ORDER — ATORVASTATIN CALCIUM 40 MG PO TABS
40.0000 mg | ORAL_TABLET | Freq: Every morning | ORAL | Status: DC
Start: 2014-07-16 — End: 2014-07-17
  Administered 2014-07-16 – 2014-07-17 (×2): 40 mg via ORAL
  Filled 2014-07-16 (×2): qty 1

## 2014-07-16 MED ORDER — ROCURONIUM BROMIDE 100 MG/10ML IV SOLN
INTRAVENOUS | Status: DC | PRN
  Administered 2014-07-16: 20 mg via INTRAVENOUS
  Administered 2014-07-16: 70 mg via INTRAVENOUS

## 2014-07-16 MED ORDER — ROCURONIUM BROMIDE 100 MG/10ML IV SOLN
INTRAVENOUS | Status: AC
  Filled 2014-07-16: qty 1

## 2014-07-16 SURGICAL SUPPLY — 51 items
CHLORAPREP W/TINT 26ML (MISCELLANEOUS) ×2 IMPLANT
CORDS BIPOLAR (ELECTRODE) ×2 IMPLANT
COVER SURGICAL LIGHT HANDLE (MISCELLANEOUS) IMPLANT
COVER TIP SHEARS 8 DVNC (MISCELLANEOUS) ×1 IMPLANT
COVER TIP SHEARS 8MM DA VINCI (MISCELLANEOUS) ×1
DRAPE ARM DVNC X/XI (DISPOSABLE) ×4 IMPLANT
DRAPE COLUMN DVNC XI (DISPOSABLE) ×1 IMPLANT
DRAPE DA VINCI XI ARM (DISPOSABLE) ×4
DRAPE DA VINCI XI COLUMN (DISPOSABLE) ×1
DRAPE SHEET LG 3/4 BI-LAMINATE (DRAPES) ×4 IMPLANT
DRAPE SURG IRRIG POUCH 19X23 (DRAPES) ×2 IMPLANT
DRAPE TABLE BACK 44X90 PK DISP (DRAPES) ×4 IMPLANT
DRAPE WARM FLUID 44X44 (DRAPE) ×2 IMPLANT
DRSG TEGADERM 6X8 (GAUZE/BANDAGES/DRESSINGS) ×4 IMPLANT
ELECT REM PT RETURN 9FT ADLT (ELECTROSURGICAL) ×2
ELECTRODE REM PT RTRN 9FT ADLT (ELECTROSURGICAL) ×1 IMPLANT
GLOVE BIO SURGEON STRL SZ 6 (GLOVE) ×6 IMPLANT
GLOVE BIO SURGEON STRL SZ 6.5 (GLOVE) ×4 IMPLANT
GOWN STRL REUS W/ TWL LRG LVL3 (GOWN DISPOSABLE) ×2 IMPLANT
GOWN STRL REUS W/TWL LRG LVL3 (GOWN DISPOSABLE) ×2
HOLDER FOLEY CATH W/STRAP (MISCELLANEOUS) ×2 IMPLANT
KIT ACCESSORY DA VINCI DISP (KITS)
KIT ACCESSORY DVNC DISP (KITS) IMPLANT
KIT BASIN OR (CUSTOM PROCEDURE TRAY) ×2 IMPLANT
LIQUID BAND (GAUZE/BANDAGES/DRESSINGS) ×2 IMPLANT
MANIPULATOR UTERINE 4.5 ZUMI (MISCELLANEOUS) IMPLANT
OCCLUDER COLPOPNEUMO (BALLOONS) ×2 IMPLANT
PEN SKIN MARKING BROAD (MISCELLANEOUS) ×2 IMPLANT
PORT ACCESS TROCAR AIRSEAL 12 (TROCAR) ×1 IMPLANT
PORT ACCESS TROCAR AIRSEAL 5M (TROCAR) ×1
POUCH SPECIMEN RETRIEVAL 10MM (ENDOMECHANICALS) IMPLANT
SEAL CANN UNIV 5-8 DVNC XI (MISCELLANEOUS) ×4 IMPLANT
SEAL XI 5MM-8MM UNIVERSAL (MISCELLANEOUS) ×4
SET TRI-LUMEN FLTR TB AIRSEAL (TUBING) ×2 IMPLANT
SET TUBE IRRIG SUCTION NO TIP (IRRIGATION / IRRIGATOR) ×2 IMPLANT
SHEET LAVH (DRAPES) ×2 IMPLANT
SOLUTION ELECTROLUBE (MISCELLANEOUS) ×2 IMPLANT
SUT VIC AB 0 CT1 27 (SUTURE) ×1
SUT VIC AB 0 CT1 27XBRD ANTBC (SUTURE) ×1 IMPLANT
SUT VIC AB 4-0 PS2 27 (SUTURE) ×4 IMPLANT
SUT VICRYL 0 UR6 27IN ABS (SUTURE) ×2 IMPLANT
SYR 50ML LL SCALE MARK (SYRINGE) ×2 IMPLANT
TOWEL OR 17X26 10 PK STRL BLUE (TOWEL DISPOSABLE) ×4 IMPLANT
TOWEL OR NON WOVEN STRL DISP B (DISPOSABLE) ×2 IMPLANT
TRAP SPECIMEN MUCOUS 40CC (MISCELLANEOUS) IMPLANT
TRAY FOLEY W/METER SILVER 14FR (SET/KITS/TRAYS/PACK) ×2 IMPLANT
TRAY LAPAROSCOPIC (CUSTOM PROCEDURE TRAY) ×2 IMPLANT
TROCAR BLADELESS OPT 5 100 (ENDOMECHANICALS) ×2 IMPLANT
TROCAR XCEL 12X100 BLDLESS (ENDOMECHANICALS) ×2 IMPLANT
TUBING INSUFFLATION 10FT LAP (TUBING) ×2 IMPLANT
WATER STERILE IRR 1500ML POUR (IV SOLUTION) ×4 IMPLANT

## 2014-07-16 NOTE — Anesthesia Postprocedure Evaluation (Signed)
  Anesthesia Post-op Note  Patient: Candace Crawford  Procedure(s) Performed: Procedure(s): XI ROBOTIC ASSISTED TOTAL HYSTERECTOMY WITH BILATERAL SALPINGO OOPHORECTOMY  (N/A)  Patient Location: PACU  Anesthesia Type:General  Level of Consciousness: awake and alert   Airway and Oxygen Therapy: Patient Spontanous Breathing  Post-op Pain: none  Post-op Assessment: Post-op Vital signs reviewed, Patient's Cardiovascular Status Stable and Respiratory Function Stable  Post-op Vital Signs: Reviewed  Filed Vitals:   07/16/14 1400  BP: 106/74  Pulse: 73  Temp: 36.3 C  Resp: 22    Complications: No apparent anesthesia complications

## 2014-07-16 NOTE — Anesthesia Procedure Notes (Signed)
Procedure Name: Intubation Date/Time: 07/16/2014 10:31 AM Performed by: Lissa Morales Pre-anesthesia Checklist: Patient identified, Emergency Drugs available, Suction available and Patient being monitored Patient Re-evaluated:Patient Re-evaluated prior to inductionOxygen Delivery Method: Circle System Utilized Preoxygenation: Pre-oxygenation with 100% oxygen Intubation Type: IV induction Ventilation: Mask ventilation without difficulty Laryngoscope Size: Mac and 3 Grade View: Grade III Tube type: Oral Number of attempts: 2 Airway Equipment and Method: Stylet and Oral airway Placement Confirmation: ETT inserted through vocal cords under direct vision,  positive ETCO2 and breath sounds checked- equal and bilateral Secured at: 21 cm Tube secured with: Tape Dental Injury: Teeth and Oropharynx as per pre-operative assessment  Difficulty Due To: Difficult Airway- due to anterior larynx, Difficult Airway- due to dentition and Difficult Airway- due to large tongue Comments: Unable to lift epiglottis with Sabra Heck 2  Or Mac 3.1st attempt esophageal with Miller 2. Used a bougie to slide under epiglottis to place ETT. Anterior larynx

## 2014-07-16 NOTE — Op Note (Signed)
OPERATIVE NOTE 07/16/15  Surgeon: Donaciano Eva   Assistants: Dr Lahoma Crocker (an MD assistant was necessary for tissue manipulation, management of robotic instrumentation, retraction and positioning due to the complexity of the case and hospital policies).   Anesthesia: General endotracheal anesthesia  ASA Class: 2   Pre-operative Diagnosis: postmenopausal bleeding  Post-operative Diagnosis: benign endometrial polyps  Operation: Robotic-assisted laparoscopic hysterectomy with bilateral salpingoophorectomy   Surgeon: Donaciano Eva  Assistant Surgeon: Lahoma Crocker MD  Anesthesia: GET  Urine Output: 100  Operative Findings:  : cervical stenosis, cervix flush with vagina, 8 week size uterus, normal tubes and ovaries, frozen section revealed benign endometrial polyps  Estimated Blood Loss:  less than 50 mL      Total IV Fluids: 1,000 ml         Specimens: PATHOLOGY uterus, tubes and ovaries         Complications:  None; patient tolerated the procedure well.         Disposition: PACU - hemodynamically stable.  Procedure Details  The patient was seen in the Holding Room. The risks, benefits, complications, treatment options, and expected outcomes were discussed with the patient.  The patient concurred with the proposed plan, giving informed consent.  The site of surgery properly noted/marked. The patient was identified as Candace Crawford and the procedure verified as a Robotic-assisted hysterectomy with bilateral salpingo oophorectomy. A Time Out was held and the above information confirmed.  After induction of anesthesia, the patient was draped and prepped in the usual sterile manner. Pt was placed in supine position after anesthesia and draped and prepped in the usual sterile manner. The abdominal drape was placed after the CholoraPrep had been allowed to dry for 3 minutes.  Her arms were tucked to her side with all appropriate precautions.  The chest was  secured to the table.  The patient was placed in the semi-lithotomy position in [place].  The perineum was prepped with Betadine.  Foley catheter was placed.  A sterile speculum was placed in the vagina.  An EEA sizer was placed in the vagina for definition of the cervix.  A pneum occluder balloon was placed over the manipulator.  A second time-out was performed.  OG tube placement was confirmed and to suction.    Procedure:  The patient was brought to the operating room where general anesthesia was administered with no complications.  The patient was placed in the dorsal lithotomy position in padded Allen stirrups.  The arms were tucked at the sides with gel pads protecting the elbows and foam protecting the hands. The patient was then prepped.  A Foley was placed to gravity.  An EEA sizer was placed in the vagina for definition of the cervicovaginal junction.  The patient was then draped in the normal manner.  Next, a 5 mm skin incision was made 1 cm below the subcostal margin in the midclavicular line.  The 5 mm Optiview port and scope was used for direct entry.  Opening pressure was under 10 mm CO2.  The abdomen was insufflated and the findings were noted as above.   At this point and all points during the procedure, the patient's intra-abdominal pressure did not exceed 15 mmHg. Next, a 10 mm skin incision was made 2cm above the umbilicus and a right and left port was placed about 10 cm lateral to the robot port on the right and left side.  A fourth arm was placed in the left lower quadrant 2  cm above and superior and medial to the anterior superior iliac spine.  All ports were placed under direct visualization.  The patient was placed in steep Trendelenburg.  Bowel was away into the upper abdomen.  The robot was docked in the normal manner.  The hysterectomy was started after the round ligament on the right side was incised and the retroperitoneum was entered and the pararectal space was developed.   The ureter was noted to be on the medial leaf of the broad ligament.  The peritoneum above the ureter was incised and stretched and the infundibulopelvic ligament was skeletonized, cauterized and cut.  The posterior peritoneum was taken down to the level of the EEA sizer.  The rectovaginal septum was created with sharp dissection. The anterior peritoneum was also taken down.  The bladder flap was created to 3cm below the cervicovaginal junction.  The uterine artery on the right side was skeletonized, cauterized and cut in the normal manner.  A similar procedure was performed on the left.  The colpotomy was made and the uterus, cervix, bilateral ovaries and tubes were amputated and delivered through the vagina.  Pedicles were inspected and excellent hemostasis was achieved.    The colpotomy at the vaginal cuff was closed with Vicryl on a CT1 needle in a running manner.  Irrigation was used and excellent hemostasis was achieved.  Frozen section revealed no invasive cancer. At this point in the procedure was completed.  Robotic instruments were removed under direct visulaization.  The robot was undocked. The 10 mm ports were closed with Vicryl on a UR-5 needle and the fascia was closed with 0 Vicryl on a UR-5 needle.  The skin was closed with 4-0 Vicryl in a subcuticular manner.  Dermabond was applied.  Sponge, lap and needle counts correct x 2.  The patient was taken to the recovery room in stable condition.  The vagina was swabbed with  minimal bleeding noted.   All instrument and needle counts were correct x  3.   The patient was transferred to the recovery room in a stable condition.   Donaciano Eva, MD

## 2014-07-16 NOTE — Transfer of Care (Signed)
Immediate Anesthesia Transfer of Care Note  Patient: Candace Crawford  Procedure(s) Performed: Procedure(s): XI ROBOTIC ASSISTED TOTAL HYSTERECTOMY WITH BILATERAL SALPINGO OOPHORECTOMY  (N/A)  Patient Location: PACU  Anesthesia Type:General  Level of Consciousness: awake, alert , oriented and patient cooperative  Airway & Oxygen Therapy: Patient Spontanous Breathing and Patient connected to face mask oxygen  Post-op Assessment: Report given to RN, Post -op Vital signs reviewed and stable and Patient moving all extremities X 4  Post vital signs: stable  Last Vitals:  Filed Vitals:   07/16/14 0833  BP: 135/68  Pulse: 69  Temp: 36.3 C  Resp: 18    Complications: No apparent anesthesia complications

## 2014-07-16 NOTE — H&P (Signed)
Assessment/Plan:  [name] is a 74 y.o. year old woman with postmenopausal bleeding and a thickened endometrial stripe a 16 mm with multiple failed attempts in the office and operating room to achieve sampling of the endometrium secondary to cervical stenosis.  I agree with Dr. Jerrilyn Cairo concern for potential occult underlying malignancy given the ultrasound findings of a cystic complex thickened endometrium with abnormal flow on ultrasound in addition to postmenopausal bleeding and risk factors of a nulliparous older woman. I believe that definitive pathologic evaluation is necessary and appears the only way to feasibly do this is through hysterectomy. Frozen section can then be performed at the time of surgery and if malignancy is confirmed staging would follow with lymphadenectomy. I discussed with the patient the potential risk for an occult cervical cancer causing cervical stenosis in which case the next fascial hysterectomy might result an increased need for the requirement of post operative radiation. However given her lack of history vaginal Pap smears, in addition to her ultrasound findings of endometrial abnormalities I believe the probable etiology of this bleeding is endometrial and endocervical in origin.  I believe she is a good candidate for a robotic-assisted total hysterectomy with BSO. I discussed operative risks with the patient including bleeding, infection, damage to internal organs (such as bladder,ureters, bowels), blood clot, reoperation and rehospitalization. I also discussed risks associated with notable musculoskeletal injury with positioning under anesthesia and risks associated with lymphadenectomy including lymphedema and lymphocyst formation. I discussed anticipated postoperative recovery and hospital duration (overnight).    HPI: Candace Crawford is a 74 year old gravida 0 who is seen in consultation at the request of Dr. Pamala Hurry for postmenopausal bleeding, and a  thickened endometrial stripe. The patient reports a single episode of postmenopausal bleeding earlier in 2016. She saw Dr. Pamala Hurry if every 2016 who attempted endometrial biopsy in the office however this was unsuccessful secondary to cervical stenosis. She then returned in March 2016 with Cytotec operation ultrasound-guided attempted endometrial biopsy and this too was unsuccessful secondary to cervical stenosis. An ultrasound scan was done to confirm the need for an endometrial biopsy and this revealed a 16 mm thickened complex endometrium with flowed noted. He was taken to the operating room on March 11 for an attempted dilation and curettage under ultrasound guidance. Once again substantial cervical stenosis was encountered. The uterus measured 8 cm on intraoperative ultrasound scan. After uterine perforation was confirmed the procedure was aborted.   Of note the patient's mother has a history of uterine cancer at a proximally age 55. The patient is nulliparous. She lives alone and is in excellent general health.   Current Meds:  Outpatient Encounter Prescriptions as of 05/29/2014  Medication Sig  . aspirin EC 81 MG tablet Take 81 mg by mouth daily.  Marland Kitchen atorvastatin (LIPITOR) 40 MG tablet Take 40 mg by mouth daily.  Marland Kitchen b complex vitamins tablet Take 1 tablet by mouth daily.  . metoprolol (LOPRESSOR) 100 MG tablet   . Multiple Vitamin (MULTIVITAMIN WITH MINERALS) TABS tablet Take 1 tablet by mouth daily.  Vladimir Faster Glycol-Propyl Glycol (SYSTANE OP) Place 1 drop into both eyes daily as needed (dryness).  . vitamin C (ASCORBIC ACID) 500 MG tablet Take 500 mg by mouth daily.  . Calcium Carb-Cholecalciferol (CALCIUM-VITAMIN D3) 600-500 MG-UNIT CAPS Take 1 tablet by mouth daily.   . Cholecalciferol (VITAMIN D3) 5000 UNITS TABS Take 1 tablet by mouth daily.  Marland Kitchen ibuprofen (ADVIL,MOTRIN) 600 MG tablet Take 1 tablet (600 mg total) by mouth  every 6 (six) hours as needed for  mild pain. (Patient not taking: Reported on 05/29/2014)  . menthol-cetylpyridinium (CEPACOL) 3 MG lozenge Take 1 lozenge (3 mg total) by mouth as needed for sore throat. (Patient not taking: Reported on 05/29/2014)  . metoprolol succinate (TOPROL-XL) 100 MG 24 hr tablet Take 100 mg by mouth daily. Take with or immediately following a meal.    Allergy:  Allergies  Allergen Reactions  . Sulfa Antibiotics Other (See Comments)    Unknown reaction but patient was told not to take these drugs  . Penicillins Rash    Social Hx:  History   Social History  . Marital Status: Widowed    Spouse Name: N/A  . Number of Children: N/A  . Years of Education: N/A   Occupational History  . Not on file.   Social History Main Topics  . Smoking status: Current Every Day Smoker -- 0.50 packs/day    Types: Cigarettes  . Smokeless tobacco: Never Used  . Alcohol Use: Yes     Comment: wine twice week  . Drug Use: No  . Sexual Activity: Yes    Birth Control/ Protection: Post-menopausal   Other Topics Concern  . Not on file   Social History Narrative    Past Surgical Hx:  Past Surgical History  Procedure Laterality Date  . Hysteroscopy w/d&c N/A 04/28/2014    surgery cancelled, patient ate, reschedule for 05/01/14  . Bilateral foot surgery      bunions  . Colonoscopy    . Hysteroscopy w/d&c N/A 05/01/2014    Procedure: CERVICAL DILATION ,HYSTEROSCOPY WITH UTERINE PERFORATION; Surgeon: Aloha Gell, MD; Location: [facility]; Service: Gynecology; Laterality: N/A;    Past Medical Hx:  Past Medical History  Diagnosis Date  . Hypertension   . Hyperlipidemia   . Depression     no meds  . Anemia   . Postmenopausal bleeding 05/01/2014    Past Gynecological History: G0. No prior abn paps. No LMP recorded. Patient is postmenopausal.  Family Hx: History reviewed.  No pertinent family history.  Review of Systems:  Constitutional  Feels well,  ENT Normal appearing ears and nares bilaterally Skin/Breast  No rash, sores, jaundice, itching, dryness Cardiovascular  No chest pain, shortness of breath, or edema  Pulmonary  No cough or wheeze.  Gastro Intestinal  No nausea, vomitting, or diarrhoea. No bright red blood per rectum, no abdominal pain, change in bowel movement, or constipation.  Genito Urinary  No frequency, urgency, dysuria, see HPI Musculo Skeletal  No myalgia, arthralgia, joint swelling or pain  Neurologic  No weakness, numbness, change in gait,  Psychology  No depression, anxiety, insomnia.   Vitals: Blood pressure 147/58, pulse 64, temperature 98.4 F (36.9 C), temperature source Oral, resp. rate 18, height 5\' 5"  (1.651 m), weight 185 lb 12.8 oz (84.278 kg).  Physical Exam: WD in NAD Neck  Supple NROM, without any enlargements.  Lymph Node Survey No cervical supraclavicular or inguinal adenopathy Cardiovascular  Pulse normal rate, regularity and rhythm. S1 and S2 normal.  Lungs  Clear to auscultation bilateraly, without wheezes/crackles/rhonchi. Good air movement.  Skin  No rash/lesions/breakdown  Psychiatry  Alert and oriented to person, place, and time  Abdomen  Normoactive bowel sounds, abdomen soft, non-tender and overweight without evidence of hernia.  Back No CVA tenderness Genito Urinary  Vulva/vagina: Normal external female genitalia. No lesions. No discharge or bleeding. Bladder/urethra: No lesions or masses, well supported bladder Vagina: normal, no lesions Cervix: Normal appearing, no  lesions. Uterus: Small, mobile, no parametrial involvement or nodularity. Adnexa: no palpable masses. Rectal  Good tone, no masses no cul de sac nodularity.  Extremities  No bilateral cyanosis, clubbing or edema.   Donaciano Eva, MD

## 2014-07-16 NOTE — Anesthesia Preprocedure Evaluation (Addendum)
Anesthesia Evaluation  Patient identified by MRN, date of birth, ID band Patient awake    Reviewed: Allergy & Precautions, H&P , NPO status , Patient's Chart, lab work & pertinent test results, reviewed documented beta blocker date and time   Airway Mallampati: II  TM Distance: >3 FB Neck ROM: Full    Dental no notable dental hx. (+) Teeth Intact, Dental Advisory Given   Pulmonary Current Smoker,  breath sounds clear to auscultation  Pulmonary exam normal       Cardiovascular hypertension, Pt. on medications and Pt. on home beta blockers Rhythm:Regular Rate:Normal     Neuro/Psych Depression negative neurological ROS     GI/Hepatic negative GI ROS, Neg liver ROS,   Endo/Other  negative endocrine ROS  Renal/GU negative Renal ROS  negative genitourinary   Musculoskeletal   Abdominal   Peds  Hematology negative hematology ROS (+)   Anesthesia Other Findings   Reproductive/Obstetrics negative OB ROS                            Anesthesia Physical Anesthesia Plan  ASA: II  Anesthesia Plan: General   Post-op Pain Management:    Induction: Intravenous  Airway Management Planned: Oral ETT  Additional Equipment:   Intra-op Plan:   Post-operative Plan: Extubation in OR  Informed Consent: I have reviewed the patients History and Physical, chart, labs and discussed the procedure including the risks, benefits and alternatives for the proposed anesthesia with the patient or authorized representative who has indicated his/her understanding and acceptance.   Dental advisory given  Plan Discussed with: CRNA  Anesthesia Plan Comments:         Anesthesia Quick Evaluation

## 2014-07-17 ENCOUNTER — Encounter (HOSPITAL_COMMUNITY): Payer: Self-pay | Admitting: Gynecologic Oncology

## 2014-07-17 DIAGNOSIS — N950 Postmenopausal bleeding: Secondary | ICD-10-CM | POA: Diagnosis not present

## 2014-07-17 LAB — CBC
HCT: 40.2 % (ref 36.0–46.0)
HEMOGLOBIN: 12.6 g/dL (ref 12.0–15.0)
MCH: 28.6 pg (ref 26.0–34.0)
MCHC: 31.3 g/dL (ref 30.0–36.0)
MCV: 91.4 fL (ref 78.0–100.0)
PLATELETS: 221 10*3/uL (ref 150–400)
RBC: 4.4 MIL/uL (ref 3.87–5.11)
RDW: 14.7 % (ref 11.5–15.5)
WBC: 10.3 10*3/uL (ref 4.0–10.5)

## 2014-07-17 LAB — BASIC METABOLIC PANEL
Anion gap: 9 (ref 5–15)
BUN: 13 mg/dL (ref 6–20)
CO2: 25 mmol/L (ref 22–32)
Calcium: 8.8 mg/dL — ABNORMAL LOW (ref 8.9–10.3)
Chloride: 107 mmol/L (ref 101–111)
Creatinine, Ser: 0.92 mg/dL (ref 0.44–1.00)
GFR calc non Af Amer: >60 mL/min (ref >60)
Glucose, Bld: 151 mg/dL — ABNORMAL HIGH (ref 65–99)
POTASSIUM: 4.2 mmol/L (ref 3.5–5.1)
SODIUM: 141 mmol/L (ref 135–145)

## 2014-07-17 MED ORDER — IBUPROFEN 800 MG PO TABS: 800.0000 mg | ORAL_TABLET | Freq: Three times a day (TID) | ORAL | Status: DC | PRN

## 2014-07-17 MED ORDER — DOCUSATE SODIUM 100 MG PO CAPS: 100.0000 mg | ORAL_CAPSULE | Freq: Two times a day (BID) | ORAL | Status: DC

## 2014-07-17 MED ORDER — OXYCODONE-ACETAMINOPHEN 5-325 MG PO TABS: 1.0000 | ORAL_TABLET | ORAL | Status: DC | PRN

## 2014-07-17 NOTE — Discharge Instructions (Signed)
07/17/2014  Return to work: 4 weeks  Activity: 1. Be up and out of the bed during the day.  Take a nap if needed.  You may walk up steps but be careful and use the hand rail.  Stair climbing will tire you more than you think, you may need to stop part way and rest.   2. No lifting or straining for 6 weeks.  3. No driving for 1 week.  Do Not drive if you are taking narcotic pain medicine.  4. Shower daily.  Use soap and water on your incision and pat dry; don't rub.   5. No sexual activity and nothing in the vagina for 6 weeks.  Diet: 1. Low sodium Heart Healthy Diet is recommended.  2. It is safe to use a laxative if you have difficulty moving your bowels.   Wound Care: 1. Keep clean and dry.  Shower daily.  Reasons to call the Doctor:   Fever - Oral temperature greater than 100.4 degrees Fahrenheit  Foul-smelling vaginal discharge  Difficulty urinating  Nausea and vomiting  Increased pain at the site of the incision that is unrelieved with pain medicine.  Difficulty breathing with or without chest pain  New calf pain especially if only on one side  Sudden, continuing increased vaginal bleeding with or without clots.   Follow-up: 1. See Everitt Amber in 4 weeks.  Contacts: For questions or concerns you should contact:  Dr. Everitt Amber at [phone]  or at [place]

## 2014-07-17 NOTE — Discharge Summary (Signed)
Physician Discharge Summary  Patient ID: Candace Crawford MRN: [id number] DOB/AGE: March 20, 1940 74 y.o.  Admit date: 07/16/2014 Discharge date: 07/17/2014  Admission Diagnoses: <principal problem not specified>  Discharge Diagnoses:  Active Problems:   Postmenopausal bleeding   Discharged Condition: good  Hospital Course: Patient was admitted for robotic hysterectomy, BSO on 07/16/14 for postmenopausal bleeding and cervical stenosis. Pathology from the surgery was benign endometrial polyps. Surgery was uncomplicated as was her postoperative course.  Consults: None  Significant Diagnostic Studies: labs: Hb postop appropriate.  CBC    Component Value Date/Time   WBC 10.3 07/17/2014 0444   RBC 4.40 07/17/2014 0444   HGB 12.6 07/17/2014 0444   HCT 40.2 07/17/2014 0444   PLT 221 07/17/2014 0444   MCV 91.4 07/17/2014 0444   MCH 28.6 07/17/2014 0444   MCHC 31.3 07/17/2014 0444   RDW 14.7 07/17/2014 0444     Treatments: surgery: see above  Discharge Exam: Blood pressure 105/88, pulse 68, temperature 98 F (36.7 C), temperature source Oral, resp. rate 16, height 5\' 5"  (1.651 m), weight 180 lb (81.647 kg), SpO2 100 %. General appearance: alert and cooperative Resp: clear to auscultation bilaterally Cardio: regular rate and rhythm, S1, S2 normal, no murmur, click, rub or gallop GI: soft, non-tender; bowel sounds normal; no masses,  no organomegaly Extremities: extremities normal, atraumatic, no cyanosis or edema Incision/Wound: incisions clean dry and in tact  Disposition: 01-Home or Self Care  Discharge Instructions    (HEART FAILURE PATIENTS) Call MD:  Anytime you have any of the following symptoms: 1) 3 pound weight gain in 24 hours or 5 pounds in 1 week 2) shortness of breath, with or without a dry hacking cough 3) swelling in the hands, feet or stomach 4) if you have to sleep on extra pillows at night in order to breathe.    Complete by:  As directed      Call MD for:   difficulty breathing, headache or visual disturbances    Complete by:  As directed      Call MD for:  extreme fatigue    Complete by:  As directed      Call MD for:  hives    Complete by:  As directed      Call MD for:  persistant dizziness or light-headedness    Complete by:  As directed      Call MD for:  persistant nausea and vomiting    Complete by:  As directed      Call MD for:  redness, tenderness, or signs of infection (pain, swelling, redness, odor or green/yellow discharge around incision site)    Complete by:  As directed      Call MD for:  severe uncontrolled pain    Complete by:  As directed      Call MD for:  temperature >100.4    Complete by:  As directed      Diet - low sodium heart healthy    Complete by:  As directed      Diet general    Complete by:  As directed      Driving Restrictions    Complete by:  As directed   No driving for 7 days or until off narcotic pain medication     Increase activity slowly    Complete by:  As directed      Remove dressing in 24 hours    Complete by:  As directed      Sexual Activity Restrictions  Complete by:  As directed   No intercourse for 6 weeks            Medication List    TAKE these medications        aspirin EC 81 MG tablet  Take 81 mg by mouth every morning.     atorvastatin 40 MG tablet  Commonly known as:  LIPITOR  Take 40 mg by mouth every morning.     docusate sodium 100 MG capsule  Commonly known as:  COLACE  Take 1 capsule (100 mg total) by mouth 2 (two) times daily.     ibuprofen 600 MG tablet  Commonly known as:  ADVIL,MOTRIN  Take 1 tablet (600 mg total) by mouth every 6 (six) hours as needed for mild pain.     ibuprofen 800 MG tablet  Commonly known as:  ADVIL,MOTRIN  Take 1 tablet (800 mg total) by mouth every 8 (eight) hours as needed (mild pain).     menthol-cetylpyridinium 3 MG lozenge  Commonly known as:  CEPACOL  Take 1 lozenge (3 mg total) by mouth as needed for sore throat.      metoprolol 100 MG tablet  Commonly known as:  LOPRESSOR  Take 100 mg by mouth every morning.     multivitamin with minerals Tabs tablet  Take 1 tablet by mouth every morning.     oxyCODONE-acetaminophen 5-325 MG per tablet  Commonly known as:  PERCOCET/ROXICET  Take 1-2 tablets by mouth every 4 (four) hours as needed (moderate to severe pain (when tolerating fluids)).     SYSTANE OP  Place 1 drop into both eyes every morning.     temazepam 15 MG capsule  Commonly known as:  RESTORIL  Take 7.5 mg by mouth at bedtime as needed for sleep.     Vitamin D3 5000 UNITS Tabs  Take 1 tablet by mouth every morning.           Follow-up Information    Follow up with Donaciano Eva, MD In 4 weeks.   Specialty:  Obstetrics and Gynecology   Contact information:   [place] [address] [phone]       Signed: Donaciano Eva 07/17/2014, 10:09 AM

## 2014-08-13 ENCOUNTER — Ambulatory Visit: Payer: Medicare Other | Attending: Gynecologic Oncology | Admitting: Gynecologic Oncology

## 2014-08-13 ENCOUNTER — Encounter: Payer: Self-pay | Admitting: Gynecologic Oncology

## 2014-08-13 VITALS — BP 142/63 | HR 74 | Temp 97.9°F | Resp 18 | Ht 65.0 in | Wt 180.9 lb

## 2014-08-13 DIAGNOSIS — Z7189 Other specified counseling: Secondary | ICD-10-CM

## 2014-08-13 DIAGNOSIS — N950 Postmenopausal bleeding: Secondary | ICD-10-CM

## 2014-08-13 NOTE — Progress Notes (Signed)
POSTOPERATIVE FOLLOWUP  HPI:  Candace Crawford is a 74 y.o. year old No obstetric history on file. initially seen in consultation on 05/29/14 referred by Dr Pamala Hurry for postmenopausal bleeding and inability to sample the endometrium.  She then underwent a robotic hysterectomy and BSO on 1/63/84 without complications.  Her postoperative course was uncomplicated.  Her final pathology revealed benign endometrial polyps and normal tubes and ovaries.  She is seen today for a postoperative check and to discuss her pathology results and ongoing plan.  Since discharge from the hospital, she is feeling well.  She has improving appetite, normal bowel and bladder function, and pain controlled with minimal PO medication. She has no other complaints today.    Review of systems: Constitutional:  She has no weight gain or weight loss. She has no fever or chills. Eyes: No blurred vision Ears, Nose, Mouth, Throat: No dizziness, headaches or changes in hearing. No mouth sores. Cardiovascular: No chest pain, palpitations or edema. Respiratory:  No shortness of breath, wheezing or cough Gastrointestinal: She has normal bowel movements without diarrhea or constipation. She denies any nausea or vomiting. She denies blood in her stool or heart burn. Genitourinary:  She denies pelvic pain, pelvic pressure or changes in her urinary function. She has no hematuria, dysuria, or incontinence. She has no irregular vaginal bleeding or vaginal discharge Musculoskeletal: Denies muscle weakness or joint pains.  Skin:  She has no skin changes, rashes or itching Neurological:  Denies dizziness or headaches. No neuropathy, no numbness or tingling. Psychiatric:  She denies depression or anxiety. Hematologic/Lymphatic:   No easy bruising or bleeding   Physical Exam: Blood pressure 142/63, pulse 74, temperature 97.9 F (36.6 C), temperature source Oral, resp. rate 18, height 5\' 5"  (1.651 m), weight 180 lb 14.4 oz (82.056 kg), SpO2 100  %. General: Well dressed, well nourished in no apparent distress.   HEENT:  Normocephalic and atraumatic, no lesions.  Extraocular muscles intact. Sclerae anicteric. Pupils equal, round, reactive. No mouth sores or ulcers. Thyroid is normal size, not nodular, midline. Skin:  No lesions or rashes. Abdomen:  Soft, nontender, nondistended.  No palpable masses.  No hepatosplenomegaly.  No ascites. Normal bowel sounds.  No hernias.  Incisions are well healed Genitourinary: Normal EGBUS  Vaginal cuff intact.  No bleeding or discharge.  No cul de sac fullness. Extremities: No cyanosis, clubbing or edema.  No calf tenderness or erythema. No palpable cords. Psychiatric: Mood and affect are appropriate. Neurological: Awake, alert and oriented x 3. Sensation is intact, no neuropathy.  Musculoskeletal: No pain, normal strength and range of motion.  Assessment:    73 y.o. year old with a history of bleeding endometrial polyps.   S/p robotic hysterectomy, BSO on 07/16/14.   Plan: 1) Pathology reports reviewed today 2) Treatment counseling - I discussed that she requires no further followup for this condition and no additional pap screening. She was given the opportunity to ask questions, which were answered to her satisfaction, and she is agreement with the above mentioned plan of care.  3)  Return to clinic on a prn basis only.  Donaciano Eva, MD

## 2014-08-13 NOTE — Patient Instructions (Signed)
Follow up with Dr. Pamala Hurry for your well women care and Dr. Denman George if issues or concerns arise in the future.  Please call for any questions or concerns.

## 2015-10-12 ENCOUNTER — Other Ambulatory Visit: Payer: Self-pay | Admitting: Pulmonary Disease

## 2015-10-12 DIAGNOSIS — E2839 Other primary ovarian failure: Secondary | ICD-10-CM

## 2015-10-12 DIAGNOSIS — Z1231 Encounter for screening mammogram for malignant neoplasm of breast: Secondary | ICD-10-CM

## 2015-10-20 ENCOUNTER — Ambulatory Visit: Payer: Self-pay | Admitting: General Surgery

## 2015-10-21 ENCOUNTER — Ambulatory Visit
Admission: RE | Admit: 2015-10-21 | Discharge: 2015-10-21 | Disposition: A | Discharge status: Home / Self Care | Payer: Medicare Other | Source: Ambulatory Visit | Attending: Pulmonary Disease | Admitting: Pulmonary Disease

## 2015-10-21 DIAGNOSIS — E2839 Other primary ovarian failure: Secondary | ICD-10-CM

## 2015-10-21 DIAGNOSIS — Z1231 Encounter for screening mammogram for malignant neoplasm of breast: Secondary | ICD-10-CM

## 2015-11-18 ENCOUNTER — Other Ambulatory Visit: Payer: Self-pay | Admitting: General Surgery

## 2016-08-22 ENCOUNTER — Ambulatory Visit (INDEPENDENT_AMBULATORY_CARE_PROVIDER_SITE_OTHER): Payer: Medicare Other | Admitting: Family Medicine

## 2016-08-22 ENCOUNTER — Encounter: Payer: Self-pay | Admitting: Family Medicine

## 2016-08-22 ENCOUNTER — Telehealth: Payer: Self-pay | Admitting: Family Medicine

## 2016-08-22 VITALS — BP 128/80 | HR 85 | Resp 12 | Ht 65.0 in | Wt 165.4 lb

## 2016-08-22 DIAGNOSIS — R0989 Other specified symptoms and signs involving the circulatory and respiratory systems: Secondary | ICD-10-CM

## 2016-08-22 DIAGNOSIS — I10 Essential (primary) hypertension: Secondary | ICD-10-CM

## 2016-08-22 DIAGNOSIS — F324 Major depressive disorder, single episode, in partial remission: Secondary | ICD-10-CM | POA: Insufficient documentation

## 2016-08-22 DIAGNOSIS — G4700 Insomnia, unspecified: Secondary | ICD-10-CM

## 2016-08-22 DIAGNOSIS — R404 Transient alteration of awareness: Secondary | ICD-10-CM | POA: Diagnosis not present

## 2016-08-22 NOTE — Telephone Encounter (Signed)
I called and spoke with patient. She didn't have any questions about her medications, she just needed a lab appointment because she wasn't aware she was suppose to have lab work done today.   - was this meant for someone else?

## 2016-08-22 NOTE — Progress Notes (Signed)
HPI:   Candace Crawford is a 76 y.o. female, who is here today to establish care.  Former PCP: Rodney Booze Last preventive routine visit: 2018.  Chronic medical problems: Recurrent UTI,migraine,HTN,HLD,depression,diverticulosis, and insomnia among some.  Hypertension:   Dx in the mid 1990's.  Currently on Metoprolol Tartrate 100 mg bid.  She is taking medications as instructed, no side effects reported.  She has not noted unusual headache, visual changes, exertional chest pain, dyspnea,  focal weakness, or edema.   Lab Results  Component Value Date   CREATININE 0.92 07/17/2014   BUN 13 07/17/2014   NA 141 07/17/2014   K 4.2 07/17/2014   CL 107 07/17/2014   CO2 25 07/17/2014    Insomnia: Sleeping well with Doxepin 5 mg,  She wakes up a couple times per night but able to go back to sleep right away. She has been on Doxepin since 04/2016.   She eats when she feels hungry,eating less frequent and less "junk" food.  She lives alone.   Concerns today:   She reported "faiting spells" upon further questioning she states that since 02/2016 she has had "black out" episodes.  She cannot provide a details about this problem because she has had most episodes while she is alone. States that first episode was in [place] ,witnessed by relatives. For a few minutes she was quite and staring ,not verbally response. She does not remember episode. She denies any preceding symptom including headache,visual changes,chest pain,palpitation,numbness,focal weakness, diaphoresis,nausea,vomiting, or changes in bowel movements. She denies LOC or Hx of seizure disorder.  About 2 months ago, she "woke up" and she was outside at night taking the trash.States that she never goes outside when it is dark.  According to pt, about a month ago she had an appt with her PCP and decided to cancel it because wanted to "enjoy the day." She remembers driving her car and being "happy" because it was a  beautiful day. Once she got home she received a phone call from her PCP's office, a nurse was calling to check on her. Apparently she showed up to her appt and was agitated, "acting crazy", she was told that it was "so bad" that she "was put down."  She does not have any recollection of this event either.  She denies having MVA. She is trying to drive just once per week to buy groceries and pay bills. States that when she is back to her senses it takes her a few seconds to recognize where she is. She denies biting tongue,urine/bowel incontinence,or myalgias.  She denies prior Hx. Hx of migraines but states that it has improved and now it is "not bad."  She has Hx of depression and "eating disorder", she denies any current symptom.  She states that she is fine even though she is unhappy here in [place]. Initially when she moved to the area (4 years ago) the plan was to move to [place], closer to her best friend. She sold her house in [place], the person who was living in the apartment she was supposed to rent decided to stay. Her friend diet a few months ago, she has not family close by, and has no friends in the area.  She denies suicidal thoughts.  + Smoker, she has not tried to quit. She denies alcohol abuse. She denies illicit drug use.   Review of Systems  Constitutional: Negative for activity change, appetite change, fatigue and fever.  HENT: Negative for facial swelling,  mouth sores, nosebleeds, sore throat and trouble swallowing.   Eyes: Negative for pain, redness and visual disturbance.  Respiratory: Negative for cough, shortness of breath and wheezing.   Cardiovascular: Negative for chest pain, palpitations and leg swelling.  Gastrointestinal: Negative for abdominal pain, blood in stool, nausea and vomiting.       Negative for changes in bowel habits.  Endocrine: Negative for cold intolerance, heat intolerance, polydipsia, polyphagia and polyuria.  Genitourinary: Negative for  decreased urine volume, dysuria and hematuria.  Musculoskeletal: Negative for gait problem and myalgias.  Skin: Negative for pallor and rash.  Allergic/Immunologic: Negative for environmental allergies.  Neurological: Negative for dizziness, syncope, facial asymmetry, speech difficulty, weakness, numbness and headaches.  Hematological: Negative for adenopathy. Does not bruise/bleed easily.  Psychiatric/Behavioral: Positive for confusion and sleep disturbance. Negative for hallucinations and suicidal ideas. The patient is nervous/anxious.      Current Outpatient Prescriptions on File Prior to Visit  Medication Sig Dispense Refill  . aspirin EC 81 MG tablet Take 81 mg by mouth every morning.     Marland Kitchen atorvastatin (LIPITOR) 40 MG tablet Take 40 mg by mouth every morning.     . metoprolol (LOPRESSOR) 100 MG tablet Take 100 mg by mouth every morning.   0  . Multiple Vitamin (MULTIVITAMIN WITH MINERALS) TABS tablet Take 1 tablet by mouth every morning.      No current facility-administered medications on file prior to visit.      Past Medical History:  Diagnosis Date  . Anemia   . Arthritis   . Chicken pox   . Depression    no meds  . Diverticulitis   . Eating disorder   . Frequent headaches   . History of frequent urinary tract infections   . Hyperlipidemia   . Hypertension   . Migraines   . Postmenopausal bleeding 05/01/2014   Allergies  Allergen Reactions  . Sulfa Antibiotics Other (See Comments)    Unknown reaction but patient was told not to take these drugs  . Penicillins Rash    Family History  Problem Relation Age of Onset  . Arthritis Mother   . Cancer Mother        uterine  . Hyperlipidemia Mother   . Heart disease Mother   . Hypertension Mother   . Arthritis Father   . Hyperlipidemia Father   . Hypertension Father     Social History   Social History  . Marital status: Widowed    Spouse name: N/A  . Number of children: N/A  . Years of education: N/A    Social History Main Topics  . Smoking status: Current Every Day Smoker    Packs/day: 0.50    Types: Cigarettes  . Smokeless tobacco: Never Used  . Alcohol use Yes     Comment: wine twice week  . Drug use: No  . Sexual activity: Yes    Birth control/ protection: Post-menopausal   Other Topics Concern  . None   Social History Narrative  . None    Vitals:   08/22/16 1151  BP: 128/80  Pulse: 85  Resp: 12   O2 sat at RA 96% Body mass index is 27.52 kg/m.   Physical Exam  Nursing note and vitals reviewed. Constitutional: She is oriented to person, place, and time. She appears well-developed. No distress.  HENT:  Head: Normocephalic and atraumatic.  Mouth/Throat: Oropharynx is clear and moist and mucous membranes are normal.  Eyes: Conjunctivae and EOM are normal. Pupils are  equal, round, and reactive to light.  Neck: No JVD present. Carotid bruit is present (? R>L). No tracheal deviation present. No thyroid mass and no thyromegaly present.  Cardiovascular: Normal rate and regular rhythm.   No murmur heard. Pulses:      Dorsalis pedis pulses are 2+ on the right side, and 2+ on the left side.  Respiratory: Effort normal and breath sounds normal. No respiratory distress.  GI: Soft. She exhibits no mass. There is no hepatomegaly. There is no tenderness.  Musculoskeletal: She exhibits no edema or tenderness.  Lymphadenopathy:    She has no cervical adenopathy.  Neurological: She is alert and oriented to person, place, and time. She has normal strength. No cranial nerve deficit. Coordination and gait normal.  Reflex Scores:      Patellar reflexes are 2+ on the right side and 2+ on the left side. Skin: Skin is warm. No rash noted. No erythema.  Psychiatric: Her mood appears anxious.  Well groomed, good eye contact.    ASSESSMENT AND PLAN:   Ms. Blyss was seen today for establish care.  Diagnoses and all orders for this visit:   Transient alteration of  awareness  We discussed possible etiologies: neurologic,metabolic, CV,psychiatric,medication side effects. Recommend avoiding driving. Referral to neuro will be arranged. She has family in [place], recommended to let them know her situation and see if somebody can stay with her at least until work up is completed. Further recommendations will be given according to labs/imaging results.  -     MR Brain W Wo Contrast; Future -     US Carotid Duplex Bilateral; Future -     TSH; Future -     Comprehensive metabolic panel; Future -     CBC; Future  Hypertension, essential, benign Adequately controlled. No changes in current management. DASH-low salt diet recommended. Eye exam recommended annually. F/U in 6 months, before if needed.  -     Comprehensive metabolic panel; Future  Carotid bruit, unspecified laterality  R>L She has not experienced dizziness. Instructed about warning signs.  -     US Carotid Duplex Bilateral; Future  Insomnia, unspecified type  Good sleep hygiene. Doxepin helping with sleep, she started it in 04/2016 when she already had an episode of amnesia. F/U in 6 months.  -     TSH; Future  Depression, major, single episode, in partial remission ([place])  Denies current symptoms. We will continue following. F/U in 6 weeks.      Kysen Wetherington G. Martinique, MD  [facility] [place] office.

## 2016-08-22 NOTE — Telephone Encounter (Signed)
There are 2 medications that aren't on the patient's medication list that she has been taking.  She wants to make sure that she is supposed to keep taking them as normal.  She is requesting a call back.

## 2016-08-22 NOTE — Patient Instructions (Signed)
A few things to remember from today's visit:   Transient alteration of awareness - Plan: MR Brain W Wo Contrast, Comprehensive metabolic panel, TSH, CBC  Hypertension, essential, benign - Plan: Comprehensive metabolic panel  Recommend avoiding driving until we can have an idea of what is happening, neuro appt and MRI will be arranged.   We have ordered labs or studies at this visit.  It can take up to 1-2 weeks for results and processing. IF results require follow up or explanation, we will call you with instructions. Clinically stable results will be released to your [facility]. If you have not heard from [place] or cannot find your results in [facility] in 2 weeks please contact our office at [phone].  If you are not yet signed up for [facility], please consider signing up  Please be sure medication list is accurate. If a new problem present, please set up appointment sooner than planned today.

## 2016-08-24 ENCOUNTER — Other Ambulatory Visit: Payer: Medicare Other

## 2016-08-24 NOTE — Telephone Encounter (Signed)
I'm not sure why she didn't ask about her medications.  It was meant for you though Judson Roch).

## 2016-08-25 ENCOUNTER — Other Ambulatory Visit: Payer: Self-pay

## 2016-08-25 ENCOUNTER — Telehealth: Payer: Self-pay | Admitting: Family Medicine

## 2016-08-25 NOTE — Telephone Encounter (Signed)
Spoke with pt's sister, Shirlean Mylar. She states that pt has had increasing memory issues for >1 year. Pt was diagnosed with Alzheimer's by her former PCP and is supposed to be on Doxepin. Sister is not sure that pt is taking her medications daily as directed. She does have significant concerns about pt's ability to care for herself given her significant short term memory issues. She states that pt believes her former PCP "does not want to take care of her anymore" and this is why she changed providers. Sister has spoken with Dr. Cecil Cobbs office and was assured this was not the case. She is not sure what is going on with pt but has considered moving her back to [place] so that she can take care of her and manage her care. Advised sister that I would speak with Dr. Martinique.  Dr. Martinique would like more information from previous PCP. They are closed today, will call them Monday to try and obtain medical/psych history on patient.

## 2016-08-25 NOTE — Telephone Encounter (Signed)
Pt states she is giving permission for you to call her sister and give her the appointment information schedule.  Advised pt she has appointment here Monday for labs. Pt will sign DPR with sister on the release. Advised pt at that time we can run her a copy of her upcoming appointments. Pt verbalized understanding.  Pt states she is new to town and has no friends. Sister is a [employer] scientist and she depends on her for help. Nothing further needed.  I have put DPR up front for pt to sign.

## 2016-08-25 NOTE — Telephone Encounter (Signed)
Spoke with pt and she DOES NOT remember coming to our office or seeing you for a visit. She does not remember needing labs, [place] or MRI - and she does not remember scheduling these. I have reviewed ALL of her upcoming appointments and given her all necessary contact information for these appointments. I also suggested she make LOTS of notes and stick them to her refrigerator, bathroom mirror and next to her TV remote control so that she has multiple reminders of her upcoming appointments. Pt admits that she is scared and very concerned that she is having multiple memory loss issues with no known cause. She is very upset that people tell her these things have happened and she has no recollection of any of the events.   I attempted to contact Dr. Cecil Cobbs office for any history on patient, however they are closed on Fridays.   Dr. Martinique - FYI - Thanks!

## 2016-08-25 NOTE — Telephone Encounter (Signed)
Pt was seen on 7-3 and does not remember meeting dr Martinique

## 2016-08-28 ENCOUNTER — Other Ambulatory Visit (INDEPENDENT_AMBULATORY_CARE_PROVIDER_SITE_OTHER): Payer: Medicare Other

## 2016-08-28 DIAGNOSIS — R404 Transient alteration of awareness: Secondary | ICD-10-CM

## 2016-08-28 DIAGNOSIS — G4700 Insomnia, unspecified: Secondary | ICD-10-CM | POA: Diagnosis not present

## 2016-08-28 DIAGNOSIS — I10 Essential (primary) hypertension: Secondary | ICD-10-CM

## 2016-08-28 LAB — COMPREHENSIVE METABOLIC PANEL
ALBUMIN: 3.8 g/dL (ref 3.5–5.2)
ALT: 14 U/L (ref 0–35)
AST: 17 U/L (ref 0–37)
Alkaline Phosphatase: 146 U/L — ABNORMAL HIGH (ref 39–117)
BUN: 11 mg/dL (ref 6–23)
CHLORIDE: 105 meq/L (ref 96–112)
CO2: 29 mEq/L (ref 19–32)
Calcium: 9.9 mg/dL (ref 8.4–10.5)
Creatinine, Ser: 1.18 mg/dL (ref 0.40–1.20)
GFR: 57.54 mL/min — ABNORMAL LOW (ref >60.00)
Glucose, Bld: 117 mg/dL — ABNORMAL HIGH (ref 70–99)
POTASSIUM: 4.5 meq/L (ref 3.5–5.1)
SODIUM: 141 meq/L (ref 135–145)
Total Bilirubin: 0.5 mg/dL (ref 0.2–1.2)
Total Protein: 7.3 g/dL (ref 6.0–8.3)

## 2016-08-28 LAB — CBC
HEMATOCRIT: 42.2 % (ref 36.0–46.0)
HEMOGLOBIN: 14.2 g/dL (ref 12.0–15.0)
MCHC: 33.7 g/dL (ref 30.0–36.0)
MCV: 91.4 fl (ref 78.0–100.0)
Platelets: 318 10*3/uL (ref 150.0–400.0)
RBC: 4.62 Mil/uL (ref 3.87–5.11)
RDW: 14.9 % (ref 11.5–15.5)
WBC: 5.8 10*3/uL (ref 4.0–10.5)

## 2016-08-28 LAB — TSH: TSH: 1.12 u[IU]/mL (ref 0.35–4.50)

## 2016-08-29 NOTE — Telephone Encounter (Signed)
Attempted to speak with someone at Dr. Cecil Cobbs office. They are refusing to discuss pt without proper HIPAA release of information. Will have patient sign at return office visit.

## 2016-09-06 ENCOUNTER — Ambulatory Visit
Admission: RE | Admit: 2016-09-06 | Discharge: 2016-09-06 | Disposition: A | Discharge status: Home / Self Care | Payer: Medicare Other | Source: Ambulatory Visit | Attending: Family Medicine | Admitting: Family Medicine

## 2016-09-06 DIAGNOSIS — R404 Transient alteration of awareness: Secondary | ICD-10-CM

## 2016-09-06 DIAGNOSIS — R0989 Other specified symptoms and signs involving the circulatory and respiratory systems: Secondary | ICD-10-CM

## 2016-09-06 DIAGNOSIS — I6523 Occlusion and stenosis of bilateral carotid arteries: Secondary | ICD-10-CM | POA: Diagnosis not present

## 2016-09-07 ENCOUNTER — Ambulatory Visit
Admission: RE | Admit: 2016-09-07 | Discharge: 2016-09-07 | Disposition: A | Discharge status: Home / Self Care | Payer: Medicare Other | Source: Ambulatory Visit | Attending: Family Medicine | Admitting: Family Medicine

## 2016-09-07 DIAGNOSIS — R404 Transient alteration of awareness: Secondary | ICD-10-CM | POA: Diagnosis not present

## 2016-09-07 MED ORDER — GADOBENATE DIMEGLUMINE 529 MG/ML IV SOLN
16.0000 mL | Freq: Once | INTRAVENOUS | Status: AC | PRN
  Administered 2016-09-07: 16 mL via INTRAVENOUS

## 2016-09-08 ENCOUNTER — Other Ambulatory Visit: Payer: Medicare Other

## 2016-09-10 ENCOUNTER — Other Ambulatory Visit: Payer: Self-pay | Admitting: Family Medicine

## 2016-09-10 DIAGNOSIS — R404 Transient alteration of awareness: Secondary | ICD-10-CM

## 2016-09-11 ENCOUNTER — Encounter: Payer: Self-pay | Admitting: Neurology

## 2016-10-02 ENCOUNTER — Ambulatory Visit: Payer: Medicare Other | Admitting: Neurology

## 2016-10-02 NOTE — Progress Notes (Signed)
HPI:   [name] is a 76 y.o. female, who is here today to follow on recent OV.   She was seen on 08/22/16, when she was reporting episodes of amnesia, "blacking out.". She has had one episode since her last OV as far as she "can actually remember." After last visit we learned from talking with her sister that she has hx of ? Dementia. She is supposed to be on Aricept.  She does not recall given authorization to talk with her sister in [place] but tells me that it is [place] if we did so and we can share information with her.     Chemistry      Component Value Date/Time   NA 141 08/28/2016 0932   K 4.5 08/28/2016 0932   CL 105 08/28/2016 0932   CO2 29 08/28/2016 0932   BUN 11 08/28/2016 0932   CREATININE 1.18 08/28/2016 0932      Component Value Date/Time   CALCIUM 9.9 08/28/2016 0932   ALKPHOS 146 (H) 08/28/2016 0932   AST 17 08/28/2016 0932   ALT 14 08/28/2016 0932   BILITOT 0.5 08/28/2016 0932      Lab Results  Component Value Date   WBC 5.8 08/28/2016   HGB 14.2 08/28/2016   HCT 42.2 08/28/2016   MCV 91.4 08/28/2016   PLT 318.0 08/28/2016   Lab Results  Component Value Date   TSH 1.12 08/28/2016    Brain MRI 09/07/16: 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 No acute abnormality  On 10/24/16 she has an appt with Dr Tomi Likens, [facility].  She lives alone and pays rent. She tells me that she has never been late with her rest, she is "always" on time. She did not drive today but sometimes she has to do so because she does not have somebody to buy her groceries.She does remember going to the grocery. States that she does not remember her last OV with me. She recalls the phone call from this office in regard to lab results.  Denies headache, visual changes, chest pain, dyspnea, palpitation, claudication, focal weakness, or edema. + Smoker.  Elevated alk phosphatase:  Denies abdominal pain, nausea, vomiting, changes in bowel habits,  blood in stool or melena. She does not drink alcohol.  Glucose 08/28/16 was elevated at 117 (151). No known Hx of DM.  Review of Systems  Constitutional: Negative for activity change, appetite change, fatigue and fever.  HENT: Negative for mouth sores, nosebleeds and trouble swallowing.   Eyes: Negative for redness and visual disturbance.  Respiratory: Negative for cough, shortness of breath and wheezing.   Cardiovascular: Negative for chest pain, palpitations and leg swelling.  Gastrointestinal: Negative for abdominal pain, nausea and vomiting.       Negative for changes in bowel habits.  Endocrine: Negative for polydipsia, polyphagia and polyuria.  Genitourinary: Negative for decreased urine volume, dysuria and hematuria.  Musculoskeletal: Negative for gait problem and myalgias.  Neurological: Negative for syncope, weakness and headaches.  Hematological: Negative for adenopathy. Does not bruise/bleed easily.  Psychiatric/Behavioral: Positive for confusion. Negative for hallucinations. The patient is nervous/anxious.      Current Outpatient Prescriptions on File Prior to Visit  Medication Sig Dispense Refill  . aspirin EC 81 MG tablet Take 81 mg by mouth every morning.     Marland Kitchen atorvastatin (LIPITOR) 40 MG tablet Take 40 mg by mouth every morning.     . donepezil (ARICEPT) 5 MG tablet Take  1 tablet (5 mg total) by mouth at bedtime. 30 tablet 0  . metoprolol (LOPRESSOR) 100 MG tablet Take 100 mg by mouth every morning.   0  . Multiple Vitamin (MULTIVITAMIN WITH MINERALS) TABS tablet Take 1 tablet by mouth every morning.      No current facility-administered medications on file prior to visit.      Past Medical History:  Diagnosis Date  . Anemia   . Arthritis   . Chicken pox   . Depression    no meds  . Diverticulitis   . Eating disorder   . Frequent headaches   . History of frequent urinary tract infections   . Hyperlipidemia   . Hypertension   . Migraines   .  Postmenopausal bleeding 05/01/2014   Allergies  Allergen Reactions  . Sulfa Antibiotics Other (See Comments)    Unknown reaction but patient was told not to take these drugs  . Penicillins Rash    Social History   Social History  . Marital status: Widowed    Spouse name: N/A  . Number of children: N/A  . Years of education: N/A   Social History Main Topics  . Smoking status: Current Every Day Smoker    Packs/day: 0.50    Types: Cigarettes  . Smokeless tobacco: Never Used  . Alcohol use Yes     Comment: wine twice week  . Drug use: No  . Sexual activity: Yes    Birth control/ protection: Post-menopausal   Other Topics Concern  . None   Social History Narrative  . None    Vitals:   10/03/16 1120  BP: 134/80  Pulse: 92  Resp: 12  SpO2: 98%   Body mass index is 27.17 kg/m.  Physical Exam  Nursing note and vitals reviewed. Constitutional: She appears well-developed. No distress.  HENT:  Head: Normocephalic and atraumatic.  Mouth/Throat: Oropharynx is clear and moist and mucous membranes are normal.  Eyes: Pupils are equal, round, and reactive to light. Conjunctivae and EOM are normal.  Neck: No thyroid mass present.  Cardiovascular: Normal rate and regular rhythm.   No murmur heard. Pulses:      Dorsalis pedis pulses are 2+ on the right side, and 2+ on the left side.  Respiratory: Effort normal and breath sounds normal. No respiratory distress.  GI: Soft. She exhibits no mass. There is no hepatomegaly. There is no tenderness.  Musculoskeletal: She exhibits no edema.  Lymphadenopathy:    She has no cervical adenopathy.  Neurological: She is alert. She has normal strength. No cranial nerve deficit. Coordination and gait normal.  Oriented in place and person. In regard to date, she remembers month and year: 8/ "20 something"/2018. Day: "Thusrday", today is Tuesday.  Skin: Skin is warm. No erythema.  Psychiatric: She has a normal mood and affect.  Well groomed,  good eye contact.    ASSESSMENT AND PLAN:  [name] was seen today for follow-up.  Diagnoses and all orders for this visit:  Lab Results  Component Value Date   ALT 11 10/03/2016   AST 13 10/03/2016   ALKPHOS 145 (H) 10/03/2016   BILITOT 0.4 10/03/2016   Lab Results  Component Value Date   HGBA1C 6.0 10/03/2016    Elevated alkaline phosphatase level  Possible etiologies discussed. Further recommendations will be given according to lab results. We will continue following.  -     Hepatic function panel -     Gamma GT  Transient alteration of awareness  Neurologic examination today otherwise normal. Word recall was normal. Clock test: She place clock hands on 10:11 twice instead 11:10.      Mini-Cog - 10/03/16 1200    Normal clock drawing test? no   How many words correct? 3     She remembers she has an appt with neuro and remembers name, not date. She also tells me that she had an appt earlier that she did not cancel but somebody did. I recommended to get one or a couple of good size calendar that she can see easy around her house. So she can write appts or important events. Keeping a diary on hand may also help to keep track with her activities during the day.   Hyperglycemia  Primary prevention through a healthy diet and regular physical activity discussed. Further recommendations will be given according to HgA1C results.  -     Hemoglobin A1c      Rishith Siddoway G. Martinique, MD  [facility]. [place] office.

## 2016-10-03 ENCOUNTER — Encounter: Payer: Self-pay | Admitting: Family Medicine

## 2016-10-03 ENCOUNTER — Ambulatory Visit (INDEPENDENT_AMBULATORY_CARE_PROVIDER_SITE_OTHER): Payer: Medicare Other | Admitting: Family Medicine

## 2016-10-03 VITALS — BP 134/80 | HR 92 | Resp 12 | Ht 65.0 in | Wt 163.2 lb

## 2016-10-03 DIAGNOSIS — R404 Transient alteration of awareness: Secondary | ICD-10-CM | POA: Diagnosis not present

## 2016-10-03 DIAGNOSIS — R748 Abnormal levels of other serum enzymes: Secondary | ICD-10-CM

## 2016-10-03 DIAGNOSIS — R739 Hyperglycemia, unspecified: Secondary | ICD-10-CM

## 2016-10-03 LAB — HEPATIC FUNCTION PANEL
ALK PHOS: 145 U/L — AB (ref 39–117)
ALT: 11 U/L (ref 0–35)
AST: 13 U/L (ref 0–37)
Albumin: 4 g/dL (ref 3.5–5.2)
BILIRUBIN DIRECT: 0.1 mg/dL (ref 0.0–0.3)
BILIRUBIN TOTAL: 0.4 mg/dL (ref 0.2–1.2)
Total Protein: 6.7 g/dL (ref 6.0–8.3)

## 2016-10-03 LAB — HEMOGLOBIN A1C: HEMOGLOBIN A1C: 6 % (ref 4.6–6.5)

## 2016-10-03 LAB — GAMMA GT: GGT: 16 U/L (ref 7–51)

## 2016-10-03 NOTE — Patient Instructions (Addendum)
A few things to remember from today's visit:   Elevated alkaline phosphatase level - Plan: Hepatic function panel, Gamma GT  Transient alteration of awareness  Appointment with Dr Tomi Likens on 10/24/2016 at 10 Am. Keep a dairy of daily activities. Keep date on "big appointment calendar" in a place that is visible to you.    Please be sure medication list is accurate. If a new problem present, please set up appointment sooner than planned today.

## 2016-10-24 ENCOUNTER — Encounter: Payer: Self-pay | Admitting: Neurology

## 2016-10-24 ENCOUNTER — Other Ambulatory Visit (INDEPENDENT_AMBULATORY_CARE_PROVIDER_SITE_OTHER): Payer: Medicare Other

## 2016-10-24 ENCOUNTER — Ambulatory Visit (INDEPENDENT_AMBULATORY_CARE_PROVIDER_SITE_OTHER): Payer: Medicare Other | Admitting: Neurology

## 2016-10-24 VITALS — BP 136/72 | HR 93 | Ht 65.5 in | Wt 163.8 lb

## 2016-10-24 DIAGNOSIS — R413 Other amnesia: Secondary | ICD-10-CM | POA: Diagnosis not present

## 2016-10-24 DIAGNOSIS — R404 Transient alteration of awareness: Secondary | ICD-10-CM | POA: Diagnosis not present

## 2016-10-24 LAB — VITAMIN B12: Vitamin B-12: 290 pg/mL (ref 211–911)

## 2016-10-24 NOTE — Patient Instructions (Signed)
1. We will schedule you for neurocognitive testing with Dr. Si Raider 2.  We will check an EEG and B12 level 3.  Follow up after testing 4.  No driving for 6 months from last episode of loss of awareness as per [facility].   Please refer to the following link on the [place] website for more information: [email or web address]

## 2016-10-24 NOTE — Progress Notes (Signed)
NEUROLOGY CONSULTATION NOTE  Candace Crawford MRN: [id number] DOB: 01-19-1941  Referring provider: Dr. Martinique Primary care provider: Dr. Martinique  Reason for consult:  Transient loss of awareness  HISTORY OF PRESENT ILLNESS: Candace Crawford is a 76 year old right-handed female with hypertension, hyperlipidemia, diverticulosis, cigarette smoker, migraine and depression who presents for recurrent transient altered awareness.  History supplemented by PCP notes.  In January 2018, she was visiting family in [place].  She has a tense relationship with her family.  She had an event witnessed by relatives who told her that she was staring and unresponsive for a few minutes.  She was amnestic to the event and doesn't recall several hours that day.  There was no associated headache, abnormal movements, tongue biting or incontinence.  A couple of months later she suddenly found herself outside at night taking out the trash.  She had a similar episode 2 weeks ago.  She had an amnestic episode in June, in which she received a call from her PCP's office and was told she showed up agitated and confused.  She has no recollection of this.  Her sister reportedly stated that she has dementia and is supposed to be on Aricept.  Candace Crawford is not aware of this.  She lives by herself.  She denies disorientation on familiar routes but will take a taxi to unfamiliar places.  She says she manages her finances and pays bills without difficulty.  She takes her medication.  She denies history of seizures, stroke, meningitis/encephalitis or head trauma.  She has a history of an eating disorder.  She has depression and insomnia.  She previously lived in [place] and is unhappy living here in [place] as she has no close friends or family nearby.  She moved to [place] about 2 years ago to live near her best friend, but her friend passed away shortly before her move.  However, she already had sold her house in [place].  She  keeps to herself and has not found a social network.  She doesn't take to others well and feels that other people don't particularly like her.    She has 2 years of college.  There is no known family history of dementia.  MRI of brain with and without contrast from 09/07/16 was personally reviewed and revealed chronic small vessel ischemic changes in the cerebral white matter and pons but no acute stroke, bleed or mass lesion.  Carotid doppler from 09/06/16 demonstrated no hemodynamically significant ICA stenosis.  08/28/16:  TSH 1.12. 10/03/16:  Hepatic panel with total bili 0.4, ALP 145 (history of elevated ALP), AST 13 aqnd ALT 11  PAST MEDICAL HISTORY: Past Medical History:  Diagnosis Date  . Anemia   . Arthritis   . Chicken pox   . Depression    no meds  . Diverticulitis   . Eating disorder   . Frequent headaches   . History of frequent urinary tract infections   . Hyperlipidemia   . Hypertension   . Migraines   . Postmenopausal bleeding 05/01/2014    PAST SURGICAL HISTORY: Past Surgical History:  Procedure Laterality Date  . ABDOMINAL HYSTERECTOMY    . bilateral foot surgery     bunions  . COLONOSCOPY    . EYE SURGERY Bilateral    "unclogged my tear ducts"  . HYSTEROSCOPY W/D&C N/A 04/28/2014   surgery cancelled, patient ate, reschedule for 05/01/14  . HYSTEROSCOPY W/D&C N/A 05/01/2014   Procedure: CERVICAL DILATION ,HYSTEROSCOPY  WITH UTERINE PERFORATION;  Surgeon: Aloha Gell, MD;  Location: [facility];  Service: Gynecology;  Laterality: N/A;  . ROBOTIC ASSISTED TOTAL HYSTERECTOMY WITH BILATERAL SALPINGO OOPHERECTOMY N/A 07/16/2014   Procedure: XI ROBOTIC ASSISTED TOTAL HYSTERECTOMY WITH BILATERAL SALPINGO OOPHORECTOMY ;  Surgeon: Everitt Amber, MD;  Location: [facility];  Service: Gynecology;  Laterality: N/A;    MEDICATIONS: Current Outpatient Prescriptions on File Prior to Visit  Medication Sig Dispense Refill  . aspirin EC 81 MG tablet Take 81 mg by mouth every morning.     Marland Kitchen  atorvastatin (LIPITOR) 40 MG tablet Take 40 mg by mouth every morning.     . metoprolol (LOPRESSOR) 100 MG tablet Take 100 mg by mouth every morning.   0  . Multiple Vitamin (MULTIVITAMIN WITH MINERALS) TABS tablet Take 1 tablet by mouth every morning.      No current facility-administered medications on file prior to visit.     ALLERGIES: Allergies  Allergen Reactions  . Sulfa Antibiotics Other (See Comments)    Unknown reaction but patient was told not to take these drugs  . Penicillins Rash    FAMILY HISTORY: Family History  Problem Relation Age of Onset  . Arthritis Mother   . Cancer Mother        uterine  . Hyperlipidemia Mother   . Heart disease Mother   . Hypertension Mother   . Arthritis Father   . Hyperlipidemia Father   . Hypertension Father     SOCIAL HISTORY: Social History   Social History  . Marital status: Widowed    Spouse name: N/A  . Number of children: N/A  . Years of education: N/A   Occupational History  . retired    Social History Main Topics  . Smoking status: Current Every Day Smoker    Packs/day: 0.50    Types: Cigarettes  . Smokeless tobacco: Never Used  . Alcohol use 4.2 oz/week    7 Glasses of wine per week     Comment: wine twice week  . Drug use: No  . Sexual activity: Yes    Birth control/ protection: Post-menopausal   Other Topics Concern  . Not on file   Social History Narrative   Lives alone   1 story apartment   No pets    REVIEW OF SYSTEMS: Constitutional: No fevers, chills, or sweats, no generalized fatigue, change in appetite Eyes: No visual changes, double vision, eye pain Ear, nose and throat: No hearing loss, ear pain, nasal congestion, sore throat Cardiovascular: No chest pain, palpitations Respiratory:  No shortness of breath at rest or with exertion, wheezes GastrointestinaI: No nausea, vomiting, diarrhea, abdominal pain, fecal incontinence Genitourinary:  No dysuria, urinary retention or  frequency Musculoskeletal:  No neck pain, back pain Integumentary: No rash, pruritus, skin lesions Neurological: as above Psychiatric: No depression, insomnia, anxiety Endocrine: No palpitations, fatigue, diaphoresis, mood swings, change in appetite, change in weight, increased thirst Hematologic/Lymphatic:  No purpura, petechiae. Allergic/Immunologic: no itchy/runny eyes, nasal congestion, recent allergic reactions, rashes  PHYSICAL EXAM: Vitals:   10/24/16 1019  BP: 136/72  Pulse: 93  SpO2: 99%   General: No acute distress.  Patient appears well-groomed.   Head:  Normocephalic/atraumatic Eyes:  fundi examined but not visualized Neck: supple, no paraspinal tenderness, full range of motion Back: No paraspinal tenderness Heart: regular rate and rhythm Lungs: Clear to auscultation bilaterally. Vascular: No carotid bruits. Neurological Exam: Mental status: alert and oriented to person, place, and year/day (not date), recent memory poor,  remote memory intact, fund of knowledge intact, attention and concentration intact except for serial 7 subtraction, speech fluent and not dysarthric, language intact. Cranial nerves: CN I: not tested CN II: pupils equal, round and reactive to light, visual fields intact CN III, IV, VI:  full range of motion, no nystagmus, no ptosis CN V: facial sensation intact CN VII: upper and lower face symmetric CN VIII: hearing intact CN IX, X: gag intact, uvula midline CN XI: sternocleidomastoid and trapezius muscles intact CN XII: tongue midline Bulk & Tone: normal, no fasciculations. Motor:  5/5 throughout  Sensation: temperature and vibration sensation intact. Deep Tendon Reflexes:  2+ throughout, toes downgoing.  Finger to nose testing:  Without dysmetria.  Heel to shin:  Without dysmetria.  Gait:  Normal station and stride.  Able to turn and tandem walk. Romberg negative.  IMPRESSION: 1.  Recurrent transient altered awareness 2.  Memory deficits.  She did score low on MoCA testing.  She reportedly has a diagnosis of dementia unknown to her.  However, she appears to be a poor historian. 3.  Depression  PLAN: 1.  We will check B12 and order neuropsychological testing. 2.  We will check EEG. 3.   Informed her no driving for 6 months from last episode of loss of awareness as per [facility].  4.  Follow up after testing.  Thank you for allowing me to take part in the care of this patient.  Metta Clines, DO  CC:  Betty Martinique, MD

## 2016-10-25 ENCOUNTER — Telehealth: Payer: Self-pay

## 2016-10-25 NOTE — Telephone Encounter (Signed)
Called Pt, LM on VM advising B12 is low, recommended she take 1044mcg B12 supplements.Asked Pt to call to confirm she rcvd the message.

## 2016-10-25 NOTE — Telephone Encounter (Signed)
-----   Message from Pieter Partridge, DO sent at 10/24/2016  6:17 PM EDT ----- B12 is low normal range.  Recommend taking 1069mcg daily

## 2016-11-01 ENCOUNTER — Other Ambulatory Visit: Payer: Medicare Other

## 2016-11-08 ENCOUNTER — Other Ambulatory Visit: Payer: Medicare Other

## 2016-11-09 ENCOUNTER — Telehealth: Payer: Self-pay | Admitting: Family Medicine

## 2016-11-09 NOTE — Telephone Encounter (Signed)
Sister Bailey Mech would like a call back to discuss pt's dx. If they have even given pt one. sister lives in [place] and states  pt's short term memory loss is getting worse.  Pt lives alone and keeps telling her the appointment have been cancelled (and they have not been). She would like to proceed with getting pt additional help.  Bailey Mech states pt has called her 6 x in the past hour and doesn't remember.  Bailey Mech would appreciate a call back.

## 2016-11-10 NOTE — Telephone Encounter (Signed)
I called and spoke with patient's sister. She is getting concerned because patient is starting to not remember things a lot more often. I did give patient's sister the phone number to [facility] to follow up on the tests they had ordered. I made a new appointment for 10/8 at 9am for 30 minutes. Patient will need to call her sister once Dr. Martinique comes into the room, so that she can listen in and see what is going on.

## 2016-11-10 NOTE — Telephone Encounter (Signed)
I will contact Robyn around 12 so that I have plenty of time to go over everything with her without interruptions.

## 2016-11-10 NOTE — Telephone Encounter (Signed)
Family is already aware of problem, which most likely will continue getting worse. I am not sure if having her sister on the phone during visit will make a big difference. She needs somebody with her to monitor 24/7, family may need to consider moving her to a assisted living facility for safety reasons.  Thanks, BJ

## 2016-11-16 ENCOUNTER — Encounter: Payer: Self-pay | Admitting: Neurology

## 2016-11-21 ENCOUNTER — Ambulatory Visit (INDEPENDENT_AMBULATORY_CARE_PROVIDER_SITE_OTHER): Payer: Medicare Other | Admitting: Family Medicine

## 2016-11-21 ENCOUNTER — Encounter: Payer: Self-pay | Admitting: Family Medicine

## 2016-11-21 VITALS — BP 128/80 | HR 107 | Resp 12 | Ht 65.0 in | Wt 158.1 lb

## 2016-11-21 DIAGNOSIS — F324 Major depressive disorder, single episode, in partial remission: Secondary | ICD-10-CM | POA: Diagnosis not present

## 2016-11-21 DIAGNOSIS — I499 Cardiac arrhythmia, unspecified: Secondary | ICD-10-CM

## 2016-11-21 DIAGNOSIS — I10 Essential (primary) hypertension: Secondary | ICD-10-CM | POA: Diagnosis not present

## 2016-11-21 DIAGNOSIS — R404 Transient alteration of awareness: Secondary | ICD-10-CM

## 2016-11-21 MED ORDER — CITALOPRAM HYDROBROMIDE 10 MG PO TABS: 10.0000 mg | ORAL_TABLET | Freq: Every day | ORAL | 1 refills | Status: DC

## 2016-11-21 NOTE — Patient Instructions (Addendum)
A few things to remember from today's visit:   Irregular heart rate - Plan: EKG 12-Lead  Depression, major, single episode, in partial remission ([place]) - Plan: citalopram (CELEXA) 10 MG tablet  Transient alteration of awareness  Today we tried to call your sister 2 times, you left a message. Celexa 10 mg started to help with mood.  I will see you in 4-6 weeks.  Keep appointments with neurologists in 01/2017.  No changes in rest of your medications.  If you have any heart symptom write it down.   Please be sure medication list is accurate. If a new problem present, please set up appointment sooner than planned today.

## 2016-11-21 NOTE — Progress Notes (Signed)
HPI:   [name] is a 76 y.o. female, who is here today to follow on recent OV.   She was seen on 10/03/16. Today's appt was arranged in coordination with her sister in [place], so she can be on the phone during her visit.  We tried to call her sister twice (beginning of visit and in the middle of her visit)  Since her last visit she has followed with neurologist, no new medications were recommended at that time. Neuropsychiatric evaluation and ECG recommended and appts pending.  She remembers our last conversation. She has been documenting daily activities. As far as she knows she has not had any episode of amnesia or confusion since her last visit.  She is telling me that a few days ago 2 women, Jehovah Witness came to her house. She told them she was not interested in changing her religion but asked them to come back the following week. They did come back and she remembers having prior conversation as well as one of the woman's face.  She is not attending church, there is one at walking distance from her house that she would like to go. She tells me that she is not sure why she is not going, she is afraid of rejected by people, so avoid social interaction.  She also remembers a conversation with her aunt, from who she had not heard for many years until yesterday.  She is not driving since her last neuro appt. She tells me that she is trying to sale her car to somebody that lives in [place], she adds that she does not trust this person, so not sure if she want to sell her car to her.   She acknowledges some depressed mood for the past 3-4 weeks. She denies suicidal thoughts. She appears me that she has not had a good relation with her sister.  Today during visit noted irregular heart rate. She denies any prior history. She denies palpitation,diaphoresis, or chest pain. Reviewing prior EKGs, 2016, PVC present. She has history of hypertension and currently she is on  Metoprolol Tartrate 100 mg daily.   Review of Systems  Constitutional: Negative for appetite change, fatigue and fever.  HENT: Negative for mouth sores, nosebleeds and trouble swallowing.   Eyes: Negative for redness and visual disturbance.  Respiratory: Negative for cough, shortness of breath and wheezing.   Cardiovascular: Negative for chest pain, palpitations and leg swelling.  Gastrointestinal: Negative for abdominal pain, nausea and vomiting.       Negative for changes in bowel habits.  Endocrine: Negative for cold intolerance and heat intolerance.  Genitourinary: Negative for decreased urine volume, dysuria and hematuria.  Musculoskeletal: Negative for gait problem and myalgias.  Neurological: Negative for seizures, syncope, weakness and headaches.  Psychiatric/Behavioral: Negative for hallucinations, sleep disturbance and suicidal ideas. The patient is nervous/anxious.       Current Outpatient Prescriptions on File Prior to Visit  Medication Sig Dispense Refill  . aspirin EC 81 MG tablet Take 81 mg by mouth every morning.     Marland Kitchen atorvastatin (LIPITOR) 40 MG tablet Take 40 mg by mouth every morning.     . metoprolol (LOPRESSOR) 100 MG tablet Take 100 mg by mouth every morning.   0  . Multiple Vitamin (MULTIVITAMIN WITH MINERALS) TABS tablet Take 1 tablet by mouth every morning.      No current facility-administered medications on file prior to visit.      Past Medical History:  Diagnosis Date  . Anemia   . Arthritis   . Chicken pox   . Depression    no meds  . Diverticulitis   . Eating disorder   . Frequent headaches   . History of frequent urinary tract infections   . Hyperlipidemia   . Hypertension   . Migraines   . Postmenopausal bleeding 05/01/2014   Allergies  Allergen Reactions  . Sulfa Antibiotics Other (See Comments)    Unknown reaction but patient was told not to take these drugs  . Penicillins Rash    Social History   Social History  . Marital  status: Widowed    Spouse name: N/A  . Number of children: N/A  . Years of education: N/A   Occupational History  . retired    Social History Main Topics  . Smoking status: Current Every Day Smoker    Packs/day: 0.50    Types: Cigarettes  . Smokeless tobacco: Never Used  . Alcohol use 4.2 oz/week    7 Glasses of wine per week     Comment: wine twice week  . Drug use: No  . Sexual activity: Yes    Birth control/ protection: Post-menopausal   Other Topics Concern  . None   Social History Narrative   Lives alone   1 story apartment   No pets    Vitals:   11/21/16 1215  BP: 128/80  Pulse: (!) 107  Resp: 12  SpO2: 93%   Body mass index is 26.31 kg/m.  Wt Readings from Last 3 Encounters:  11/21/16 158 lb 2 oz (71.7 kg)  10/24/16 163 lb 12.8 oz (74.3 kg)  10/03/16 163 lb 4 oz (74 kg)    Physical Exam  Nursing note and vitals reviewed. Constitutional: She is oriented to person, place, and time. She appears well-developed. No distress.  HENT:  Head: Normocephalic and atraumatic.  Mouth/Throat: Oropharynx is clear and moist and mucous membranes are normal.  Eyes: Pupils are equal, round, and reactive to light. Conjunctivae and EOM are normal.  Cardiovascular: An irregular rhythm present. Tachycardia present.   No murmur heard. Pulses:      Dorsalis pedis pulses are 2+ on the right side, and 2+ on the left side.  Respiratory: Effort normal and breath sounds normal. No respiratory distress.  GI: Soft. She exhibits no mass. There is no hepatomegaly. There is no tenderness.  Musculoskeletal: She exhibits no edema.  Lymphadenopathy:    She has no cervical adenopathy.  Neurological: She is alert and oriented to person, place, and time. She has normal strength. Coordination and gait normal.  Oriented x 3   Skin: Skin is warm. No rash noted. No erythema.  Psychiatric: Her mood appears anxious.  Well groomed, good eye contact.    ASSESSMENT AND PLAN:   [name]  was seen today for follow-up.  Diagnoses and all orders for this visit:  Irregular heart rate  Asymptomatic. EKG done today: SR, PAC's, normal axis, mildly short PR interval.Unspecific ST abnormalities. Compared with EKG 04/2014: PVC's not longer present. Rest no significant changes. Instructed about warning signs. I recommend writing down any cardiac like symptoms she experience. She voices understanding.  -     EKG 12-Lead  Transient alteration of awareness  It seems to be stable. She is oriented X 3 today. She seems to remember events for the past few days, nobody available to corroborate information given. She is aware of appts with neuro for neuropsychiatric evaluation and EEG.  Depression,  major, single episode, in partial remission ([place])  We dicussed treatment options, she agrees with trying low dose Celexa. She understands side effects. Instructed about warning signs. F/U in 4-5 weeks.  -     citalopram (CELEXA) 10 MG tablet; Take 1 tablet (10 mg total) by mouth daily.  Hypertension, essential, benign  Adequately controlled. No changes in current management, she is on Metoprolol Tartrate once daily, no changes for now.    12:31 pm to 1:14 pm OV 40 min face to face OV. > 50% was dedicated to discussion of above Dx, prognosis of some, treatment options, and some side effects of medications use for depression. We were supposed to have her sister on speaker, not able to reach her. Message was left and she did not return call while she was still here. She does not want to go back to [place], does not have a good relation with her sisters.      Marquelle Balow G. Martinique, MD  [facility]. Time office.

## 2016-11-27 ENCOUNTER — Ambulatory Visit: Payer: Medicare Other | Admitting: Family Medicine

## 2016-11-27 ENCOUNTER — Telehealth: Payer: Self-pay | Admitting: Family Medicine

## 2016-11-27 NOTE — Telephone Encounter (Signed)
Pt state that she needs a call back to discuss a personal matter.

## 2016-11-28 ENCOUNTER — Ambulatory Visit: Payer: Medicare Other | Admitting: Family Medicine

## 2016-11-28 ENCOUNTER — Telehealth: Payer: Self-pay | Admitting: Family Medicine

## 2016-11-28 NOTE — Telephone Encounter (Signed)
Sister Cherie Ouch would like you to give her a call when you can please. [phone]

## 2016-11-28 NOTE — Telephone Encounter (Signed)
I called and spoke with patient. She doesn't remember what she had called about, and will call back if needed.

## 2016-11-28 NOTE — Telephone Encounter (Signed)
Do you mind calling Ms Tenbrink's sister. Explained I usually see pts all day and it is hard to call her. I really was hoping to get her in the phone when Ms Knoff was here but we could not reach her. She is following with neurologists for her memory issues, she has an appt coming.  Thanks, BJ

## 2016-12-01 NOTE — Telephone Encounter (Signed)
10:48 am: Tried to reach [name]'s sister.  10:49 Am Spoke with [name] is [name]'s sister.  [name] lives in [place], due to her work schedule she can not visit her sister regularly. She is very concerned about [name] health. She states that she calls her daily, sometimes 2-3 times she speaks with her on the phone, and has noted drastic changes in her personality. She sometimes is "lucid" and can remember things/conversations but other times she is aggressive, "erratic" , and does not recall prior conversations.  She is thinking about taking "legal action" and bring [name] back to [place], so she can monitor closely. She knows about [name]'s Hx of depression. Celexa 10 mg was started last OV and she has f/u appt 12/2016.  We reviewed medications, scheduled appts with Dr Tomi Likens and with me, possible Dx, recent brain MRI results, finding on last examination,and work-up planned by neuro. I reiterated the importance of having somebody with her 24/7 and during her next appt with Dr Tomi Likens.  She will try to be here before next neuro appt.  [name] works from home Candace Crawford.  Betty Martinique, MD

## 2016-12-01 NOTE — Telephone Encounter (Signed)
Can you please give patient's sister a call to explain to her what is going on? Patient had called into the after hours center about how she can't remember anything. Patient had called the office this week and when I returned her call, she didn't remember why she had called [place].

## 2016-12-12 ENCOUNTER — Telehealth: Payer: Self-pay | Admitting: Neurology

## 2016-12-12 NOTE — Telephone Encounter (Signed)
Pt's sister Bailey Mech called and said she lives in [place] and really would like to speak to Dr Tomi Likens concerning Pt  because she can't be at her appointment with her  CB# [phone]

## 2016-12-13 NOTE — Telephone Encounter (Signed)
Spoke with Candace Crawford, she will be in town 11/30 and 12/3 from [place], would really like to be seen then. Advsd her I will see if there is any way we can work it out and call her back

## 2016-12-19 NOTE — Telephone Encounter (Signed)
So far, there have not been any cancellations, will keep checking

## 2017-01-02 ENCOUNTER — Encounter: Payer: Self-pay | Admitting: Family Medicine

## 2017-01-02 ENCOUNTER — Ambulatory Visit (INDEPENDENT_AMBULATORY_CARE_PROVIDER_SITE_OTHER): Payer: Medicare Other | Admitting: Family Medicine

## 2017-01-02 VITALS — BP 122/74 | HR 91 | Temp 98.1°F | Resp 12 | Ht 65.0 in | Wt 147.0 lb

## 2017-01-02 DIAGNOSIS — I10 Essential (primary) hypertension: Secondary | ICD-10-CM

## 2017-01-02 DIAGNOSIS — F324 Major depressive disorder, single episode, in partial remission: Secondary | ICD-10-CM | POA: Diagnosis not present

## 2017-01-02 DIAGNOSIS — Z23 Encounter for immunization: Secondary | ICD-10-CM

## 2017-01-02 DIAGNOSIS — E538 Deficiency of other specified B group vitamins: Secondary | ICD-10-CM | POA: Diagnosis not present

## 2017-01-02 DIAGNOSIS — R404 Transient alteration of awareness: Secondary | ICD-10-CM | POA: Diagnosis not present

## 2017-01-02 MED ORDER — CYANOCOBALAMIN 1000 MCG/ML IJ SOLN
1000.0000 ug | Freq: Once | INTRAMUSCULAR | Status: AC
  Administered 2017-01-02: 1000 ug via INTRAMUSCULAR

## 2017-01-02 NOTE — Progress Notes (Signed)
HPI:   Candace.Candace Crawford is a 76 y.o. female, who is here today to follow on some chronic medical problems.  She was last seen on 01/02/17, last OV we agreed on starting Celexa 10 mg. She has Hx of depression and last OV she felt like she would benefit from medication, c/o mildly depressed. Hx of memory deficit, short term memory mainly.  She did not pick up medication. Today she tells me that she does not think she needs medication, she feels "great."  She remember our last visit when we were trying to reach her sister. I let her know I spoke with her sister after her visit and discussed plan. She is asking me why I am calling her sister, she is bothered by the fact her sister is concerned about her.  She tells me that her sister is planning is coming in about 2 weeks to visit.    She is not driving, she tells me she sold her car. Last OV I noted mildly irregular HR.  HTN: Taking Metoprolol Tartrate 100 mg daily. She has no noted chest pain, palpitations, or dyspnea.  B12 deficiency, she is not on B12 supplementation.  Lab Results  Component Value Date   [id number] 10/24/2016    I noted weight loss, she tells me that she eats when she is hungry. She has not noted changes in how her clothes fit. Colonoscopy in 02/2016 and mammogram in 09/2015.   Denies abdominal pain, nausea, vomiting, changes in bowel habits,or urinary symptoms.  She drinks a glass of wine daily with dinner.  She has no concerns today.  Review of Systems  Constitutional: Negative for activity change, appetite change, fatigue, fever and unexpected weight change.  HENT: Negative for mouth sores, nosebleeds and trouble swallowing.   Eyes: Negative for redness and visual disturbance.  Respiratory: Negative for cough, shortness of breath and wheezing.   Cardiovascular: Negative for chest pain, palpitations and leg swelling.  Gastrointestinal: Negative for abdominal pain, nausea and vomiting.   Negative for changes in bowel habits.  Endocrine: Negative for cold intolerance and heat intolerance.  Genitourinary: Negative for decreased urine volume, dysuria and hematuria.  Neurological: Negative for seizures, syncope, weakness and headaches.  Hematological: Negative for adenopathy. Does not bruise/bleed easily.  Psychiatric/Behavioral: Negative for confusion and sleep disturbance. The patient is nervous/anxious.       Current Outpatient Medications on File Prior to Visit  Medication Sig Dispense Refill  . aspirin EC 81 MG tablet Take 81 mg by mouth every morning.     Marland Kitchen atorvastatin (LIPITOR) 40 MG tablet Take 40 mg by mouth every morning.     . metoprolol (LOPRESSOR) 100 MG tablet Take 100 mg by mouth every morning.   0  . Multiple Vitamin (MULTIVITAMIN WITH MINERALS) TABS tablet Take 1 tablet by mouth every morning.      No current facility-administered medications on file prior to visit.      Past Medical History:  Diagnosis Date  . Anemia   . Arthritis   . Chicken pox   . Depression    no meds  . Diverticulitis   . Eating disorder   . Frequent headaches   . History of frequent urinary tract infections   . Hyperlipidemia   . Hypertension   . Migraines   . Postmenopausal bleeding 05/01/2014   Allergies  Allergen Reactions  . Sulfa Antibiotics Other (See Comments)    Unknown reaction but patient was told not to  take these drugs  . Penicillins Rash    Social History   Socioeconomic History  . Marital status: Widowed    Spouse name: None  . Number of children: None  . Years of education: None  . Highest education level: None  Social Needs  . Financial resource strain: None  . Food insecurity - worry: None  . Food insecurity - inability: None  . Transportation needs - medical: None  . Transportation needs - non-medical: None  Occupational History  . Occupation: retired  Tobacco Use  . Smoking status: Current Every Day Smoker    Packs/day: 0.50     Types: Cigarettes  . Smokeless tobacco: Never Used  Substance and Sexual Activity  . Alcohol use: Yes    Alcohol/week: 4.2 oz    Types: 7 Glasses of wine per week    Comment: wine twice week  . Drug use: No  . Sexual activity: Yes    Birth control/protection: Post-menopausal  Other Topics Concern  . None  Social History Narrative   Lives alone   1 story apartment   No pets    Vitals:   01/02/17 1027  BP: 122/74  Pulse: 91  Resp: 12  Temp: 98.1 F (36.7 C)  SpO2: 95%   Body mass index is 24.46 kg/m.  Wt Readings from Last 3 Encounters:  01/02/17 147 lb (66.7 kg)  11/21/16 158 lb 2 oz (71.7 kg)  10/24/16 163 lb 12.8 oz (74.3 kg)     Physical Exam  Nursing note and vitals reviewed. Constitutional: She appears well-developed. No distress.  HENT:  Head: Normocephalic and atraumatic.  Mouth/Throat: Oropharynx is clear and moist and mucous membranes are normal.  Eyes: Conjunctivae are normal. Pupils are equal, round, and reactive to light.  Cardiovascular: Normal rate and regular rhythm.  No murmur heard. Pulses:      Dorsalis pedis pulses are 2+ on the right side, and 2+ on the left side.  Respiratory: Effort normal and breath sounds normal. No respiratory distress.  GI: Soft. She exhibits no mass. There is no hepatomegaly. There is no tenderness.  Musculoskeletal: She exhibits no edema or tenderness.  Lymphadenopathy:    She has no cervical adenopathy.  Neurological: She is alert. She has normal strength. Gait normal.  Oriented in person,place,and remembers month and year.  Skin: Skin is warm. No erythema.  Psychiatric: Her mood appears anxious.  Well groomed, good eye contact.      ASSESSMENT AND PLAN:   Candace. Candace Crawford was seen today for follow-up.  Diagnoses and all orders for this visit:  B12 deficiency  After verbal consent she received B12 1000 mcg IM x 1.  -     cyanocobalamin ((VITAMIN B-12)) injection 1,000 mcg  Hypertension, essential,  benign  Adequately controlled. No changes in current management, states that she doe snot need refills. Eye exam reported as current. F/U in 3-4 months.  Transient alteration of awareness  Today she tells me that she is not longer having episode of memory loss. She does not think she has problems with her memory, does not understands why her sister wants her back in [place].  Reminded her about her appt coming for neuropsychiatric evaluation and to see Dr Tomi Likens.  I spoke with her sister after her visit.  She is concerned about worsening memory and mood, she is frequently aggressive towards her,does not recall prior conversations.  She states that she has tried to reach Dr Tomi Likens to discuss her concerns. I explained that  these needs to be discussed in person, so ideally she needs to be with her during visits and make necessary arrangements while she is here. She will go with Candace Crawford to her appt with neuro 01/22/17.  Need for influenza vaccination -     Flu vaccine HIGH DOSE PF  Depression, major, single episode, in partial remission ([place])  Denies having symptoms. She is not interested in medication.  She has lost about 16 Lb since 10/2016. Most likely related to decreased food intake.   It is difficult to give dietary recommendations, she argues that she is eating but does not eat if she is not hungry.  She does not think this is a problem and it bothers her that I am asking about her her diet. We will continue monitoring.   -Candace. Candace Crawford was advised to return sooner than planned today if new concerns arise.       Betty G. Martinique, MD  [facility]. [place] office.

## 2017-01-02 NOTE — Patient Instructions (Addendum)
A few things to remember from today's visit:   Hypertension, essential, benign  B12 deficiency  Transient alteration of awareness  Today you got B12 injection and Flu shot.  No changes in your medications and we will hold on Celexa.  Keep appointments with neurologist.   Please be sure medication list is accurate. If a new problem present, please set up appointment sooner than planned today.

## 2017-01-07 ENCOUNTER — Encounter: Payer: Self-pay | Admitting: Family Medicine

## 2017-01-16 NOTE — Telephone Encounter (Signed)
Called and spoke with Bailey Mech again, she said she would be in town on 11/29 also.advsd her no cancellations but if we have any for those days, I can call her, but will be last min, she said that was fine

## 2017-01-22 ENCOUNTER — Encounter: Payer: Self-pay | Admitting: Psychology

## 2017-01-22 ENCOUNTER — Ambulatory Visit (INDEPENDENT_AMBULATORY_CARE_PROVIDER_SITE_OTHER): Payer: Medicare Other | Admitting: Psychology

## 2017-01-22 ENCOUNTER — Ambulatory Visit: Payer: Medicare Other | Admitting: Psychology

## 2017-01-22 DIAGNOSIS — R413 Other amnesia: Secondary | ICD-10-CM

## 2017-01-22 DIAGNOSIS — F329 Major depressive disorder, single episode, unspecified: Secondary | ICD-10-CM

## 2017-01-22 DIAGNOSIS — R404 Transient alteration of awareness: Secondary | ICD-10-CM

## 2017-01-22 DIAGNOSIS — F32A Depression, unspecified: Secondary | ICD-10-CM

## 2017-01-22 NOTE — Progress Notes (Signed)
   Neuropsychology Note  Candace Crawford came in today for 1 hour of neuropsychological testing with technician, Milana Kidney, BS, under the supervision of Dr. Macarthur Critchley. The patient did not appear overtly distressed by the testing session, per behavioral observation or via self-report to the technician. Rest breaks were offered. [name] will return within 2 weeks for a feedback session with Dr. Si Raider at which time her test performances, clinical impressions and treatment recommendations will be reviewed in detail. The patient understands she can contact our office should she require our assistance before this time.  Full report to follow.

## 2017-01-22 NOTE — Progress Notes (Signed)
NEUROPSYCHOLOGICAL INTERVIEW (CPT: [id number])  Name: Candace Crawford Date of Birth: Aug 24, 1940 Date of Interview: 01/22/2017  Reason for Referral:  Candace Crawford is a 76 y.o. female who is referred for neuropsychological evaluation by Dr. Metta Clines of [facility] due to concerns about memory lapses. This patient is accompanied in the office by her sister, Shirlean Mylar, who supplements the history.  History of Presenting Problem:  Candace. Avino was seen by Dr. Tomi Likens for neurologic consultation on 10/24/2016 for transient loss of awareness. At that time she reported an episode of staring in January 2018 observed by family members as well as at least three subsequent episodes of memory lapses for whole events. She denied history of seizures, stroke, meningitis/encephalitis or head trauma. There is no known family history of dementia. 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 MoCA on 10/24/2016 was 17/30. EEG, neuropsychological testing and bloodwork were recommended. B12 was found to be low normal and 1098mcg daily supplementation was recommended. EEG has not yet been scheduled. Per PCP notes, the patient's sister Shirlean Mylar has expressed several concerns about changes in the patient, including short term memory loss and personality changes. (Per 11/28/2016 phone call, "She is very concerned about Candace Crawford health. She states that she calls her daily, sometimes 2-3 times she speaks with her on the phone, and has noted drastic changes in her personality. She sometimes is "lucid" and can remember things/conversations but other times she is aggressive, "erratic" , and does not recall prior conversations. She is thinking about taking "legal action" and bring Candace Crawford back to [place], so she can monitor closely.")  During our clinical interview today (01/22/2017), the patient is lucid with  normal communication and relatively linear thought process. However, she is a poor historian, sometimes contradicting herself. She allows her sister to provide informant report and does not seem upset or threatened by this. The patient reports she has had a few more episodes of memory lapses for events, but denies any daily memory difficulty - then later, she admits that she is having short term memory loss and frequently forgets she has had conversations. Her sister reports she first noticed memory problems when the patient was visiting Thanksgiving 2017. She has observed progressive memory decline over the past year. She speaks to the patient on the phone daily. She would like the patient to return to living in [place]. The patient has no family or friends here in [place]. She moved her to be close to a friend who then apparently passed away. She has no social contact here. She wanted to find a church and apparently visited different churches each weekend and ultimately found one she really liked but never returned. She can't tell me why she never returned, and states "I can't even explain it to myself." The patient is hesitant to return to [place] because of family tension. She reports that the day prior to her move to [place] 5 years ago, she had a conversation with her family members that hurt her and since then she does not feel any of her family, aside from [place], wants her in their lives. Shirlean Mylar, on the other hand, states that the patient was the one to initiate the negative conversation and in fact was expressing to the rest of the family that she didn't want them in her life. It appears there have  been several instances like this wherein the patient remembers recent events much differently than the other individuals who were present. Of note, there was no history of significant family tension or conflict prior to that conversation 5 years ago.  The main cognitive concerns at this time are forgetfulness  for recent conversations and events and frequent repeating of herself. She will call her sister 4 to 5 times a day and have no memory of having done so. They report occasional word finding difficulty. She is also much more confused when given multiple pieces of information. Appointment scheduling is particularly difficult for her. Her sister is now helping with this. She gets irritated and agitated if she is overwhelmed by information. She is having difficulty remembering to take her medications. She reports she thinks she is taking them correctly but then when she checks she sees she hasn't been. She is also forgetting to eat meals and not feeling hungry. She has lost weight. She has a long history of weight loss and poor food intake in the context of depression. She lives alone in an apartment. She apparently manages her finances with no problems. She does her own housekeeping and is keeping up with this. Hygiene is also appropriate and there have been no changes in self-care per her sister. She did stop driving, per Dr. Georgie Chard recommendation. Of note, there was a lot of controversy surrounding the sale of her car. She recalls the events differently than the people she was trying to sell it to. She actually filed a police report because she does not think she has been paid for the vehicle although a friend's boyfriend has the car. Her sister has spoken to the person who apparently is buying the car and they have a very different story. It is impossible for her sister to sort out what actually happened.  The patient admits, and her sister confirms, that she has a tendency to get very fixated on something and then has trouble paying attention to anything else. The patient also reports that she is very "routine" and gets nervous about deviating from her routine.  Shirlean Mylar reports that the patient's cognitive symptoms are progressively worsening over time but are also fluctuating; on some days she is able to  remember things that on other days she cannot.  She has not had any issues with balance or walking. She has not had any falls. She complains of knee pain to her sister.   She has a history of depression. She denies feeling particularly dysphoric at the present time but it appears her perception of her mood fluctuates. She endorses feelings of hopelessness and has thoughts of wishing she was dead or thinking "What's the point?" However, she denies suicidal ideation or intention. She has not had any hallucinations. She does drink alcohol, one glass of wine most days of the week. She denies history of heavy drinking or substance abuse. She is a daily cigarette smoker (10 cigarettes per day on average).   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 Marital history: Widowed. Children: None   Medical History: Past Medical History:  Diagnosis Date  . Anemia   . Arthritis   . Chicken pox   . Depression    no meds  . Diverticulitis   . Eating disorder   . Frequent headaches   . History of frequent urinary tract infections   . Hyperlipidemia   . Hypertension   .  Migraines   . Postmenopausal bleeding 05/01/2014     Current Medications:  Outpatient Encounter Medications as of 01/22/2017  Medication Sig  . aspirin EC 81 MG tablet Take 81 mg by mouth every morning.   Marland Kitchen atorvastatin (LIPITOR) 40 MG tablet Take 40 mg by mouth every morning.   . metoprolol (LOPRESSOR) 100 MG tablet Take 100 mg by mouth every morning.   . Multiple Vitamin (MULTIVITAMIN WITH MINERALS) TABS tablet Take 1 tablet by mouth every morning.    No facility-administered encounter medications on file as of 01/22/2017.      Behavioral Observations:   Appearance: Neatly appropriately dressed and groomed Gait: Ambulated independently, no gross abnormalities observed Speech: Fluent; normal rate, rhythm and volume. Mild word  finding difficulty. Comprehension appears intact. Thought process: Generally linear, goal directed Affect: Mildly blunted, does become tearful when speaking of her estrangement from some family members Interpersonal: Pleasant, appropriate The patient arrived 20 minutes late after having gone to the wrong office.   TESTING: There is medical necessity to proceed with neuropsychological assessment as the results will be used to aid in differential diagnosis and clinical decision-making and to inform specific treatment recommendations. Per the patient, her sister and medical records reviewed, there has been a change in cognitive functioning and a reasonable suspicion of dementia.  Following the clinical interview, the patient completed a full battery of neuropsychological testing with my psychometrician under my supervision.   PLAN: The patient will return to see me for a follow-up session at which time her test performances and my impressions and treatment recommendations will be reviewed in detail.  Full report to follow.

## 2017-02-02 ENCOUNTER — Encounter: Payer: Self-pay | Admitting: Family Medicine

## 2017-02-02 ENCOUNTER — Ambulatory Visit (INDEPENDENT_AMBULATORY_CARE_PROVIDER_SITE_OTHER): Payer: Medicare Other | Admitting: Family Medicine

## 2017-02-02 VITALS — BP 124/72 | HR 96 | Temp 98.5°F | Resp 14 | Ht 65.0 in | Wt 144.5 lb

## 2017-02-02 DIAGNOSIS — R634 Abnormal weight loss: Secondary | ICD-10-CM | POA: Diagnosis not present

## 2017-02-02 DIAGNOSIS — R404 Transient alteration of awareness: Secondary | ICD-10-CM | POA: Diagnosis not present

## 2017-02-02 DIAGNOSIS — F324 Major depressive disorder, single episode, in partial remission: Secondary | ICD-10-CM

## 2017-02-02 DIAGNOSIS — I10 Essential (primary) hypertension: Secondary | ICD-10-CM

## 2017-02-02 MED ORDER — METOPROLOL SUCCINATE ER 50 MG PO TB24: 50.0000 mg | ORAL_TABLET | Freq: Every day | ORAL | 1 refills | Status: AC

## 2017-02-02 NOTE — Progress Notes (Signed)
HPI:   Candace Crawford is a 76 y.o. female, who is here today to follow on recent OV.   Hx of memory deficit and episodes of amnesia.  Etiology is unclear, she has a remote hx of dementia and was on Aricept.  Hx of depression, denies current symptoms.A few visits ago she voiced concerns about depressed mood, Celexa recommended but she did not fill Rx. She has not been interested in trying medication. She lives alone and not longer driving. She follows with neurologist, Dr. Tomi Likens.  She was seen on January 02, 2017.  Since her last visit she has had neuropsychiatric evaluation, she remembers her sister going to her appointment with her. Her sister went back home,[place], a few days ago. She describes her sister's visit as pleasant , "we had a good time."  Her sister has been concerned about Candace Crawford memory and "personality" changes; She has tried to persuade her to go back to [place]; where most of her family lives but Candace Crawford is not interested in moving back there.  Denies changes in appetite, we have been following her wt. States that eats when she feels hungry. She states that she "always has had a eating problem" from eating too much and gaining wt to not eating and having issues with wt loss."This is not new" she adds.   Hypertension: She Is Currently on Metoprolol Titrate 100 mg daily. She acknowledges that she is forgetting taking her medications a few times per week, not sure about frequency. Denies headache, visual changes, chest pain, dyspnea, palpitation, claudication, focal weakness, or edema.   Review of Systems  Constitutional: Negative for activity change, appetite change, fatigue and fever.  HENT: Negative for mouth sores, nosebleeds and trouble swallowing.   Eyes: Negative for redness and visual disturbance.  Respiratory: Negative for cough, shortness of breath and wheezing.   Cardiovascular: Negative for chest pain, palpitations and leg swelling.    Gastrointestinal: Negative for abdominal pain, nausea and vomiting.       Negative for changes in bowel habits.  Endocrine: Negative for cold intolerance and heat intolerance.  Genitourinary: Negative for decreased urine volume, dysuria and hematuria.  Musculoskeletal: Negative for gait problem and myalgias.  Skin: Negative for rash and wound.  Neurological: Negative for seizures, syncope, weakness and headaches.  Psychiatric/Behavioral: Negative for sleep disturbance and suicidal ideas. The patient is nervous/anxious.       Current Outpatient Medications on File Prior to Visit  Medication Sig Dispense Refill  . aspirin EC 81 MG tablet Take 81 mg by mouth every morning.     Marland Kitchen atorvastatin (LIPITOR) 40 MG tablet Take 40 mg by mouth every morning.     . Multiple Vitamin (MULTIVITAMIN WITH MINERALS) TABS tablet Take 1 tablet by mouth every morning.      No current facility-administered medications on file prior to visit.      Past Medical History:  Diagnosis Date  . Anemia   . Arthritis   . Chicken pox   . Depression    no meds  . Diverticulitis   . Eating disorder   . Frequent headaches   . History of frequent urinary tract infections   . Hyperlipidemia   . Hypertension   . Migraines   . Postmenopausal bleeding 05/01/2014   Allergies  Allergen Reactions  . Sulfa Antibiotics Other (See Comments)    Unknown reaction but patient was told not to take these drugs  . Penicillins Rash  Social History   Socioeconomic History  . Marital status: Widowed    Spouse name: None  . Number of children: None  . Years of education: None  . Highest education level: None  Social Needs  . Financial resource strain: None  . Food insecurity - worry: None  . Food insecurity - inability: None  . Transportation needs - medical: None  . Transportation needs - non-medical: None  Occupational History  . Occupation: retired  Tobacco Use  . Smoking status: Current Every Day Smoker     Packs/day: 0.50    Types: Cigarettes  . Smokeless tobacco: Never Used  Substance and Sexual Activity  . Alcohol use: Yes    Alcohol/week: 4.2 oz    Types: 7 Glasses of wine per week    Comment: one glass of wine most days a week  . Drug use: No  . Sexual activity: Yes    Birth control/protection: Post-menopausal  Other Topics Concern  . None  Social History Narrative   Lives alone   1 story apartment   No pets    Vitals:   02/02/17 0933  BP: 124/72  Pulse: 96  Resp: 14  Temp: 98.5 F (36.9 C)  SpO2: 95%   Body mass index is 24.05 kg/m.   Wt Readings from Last 3 Encounters:  02/02/17 144 lb 8 oz (65.5 kg)  01/02/17 147 lb (66.7 kg)  11/21/16 158 lb 2 oz (71.7 kg)     Physical Exam  Nursing note and vitals reviewed. Constitutional: She is oriented to person, place, and time. She appears well-developed and well-nourished. No distress.  HENT:  Head: Normocephalic and atraumatic.  Mouth/Throat: Oropharynx is clear and moist and mucous membranes are normal.  Eyes: Conjunctivae are normal. Pupils are equal, round, and reactive to light.  Cardiovascular: Normal rate and regular rhythm.  No murmur heard. Respiratory: Effort normal and breath sounds normal. No respiratory distress.  GI: Soft. She exhibits no mass. There is no hepatomegaly. There is no tenderness.  Musculoskeletal: She exhibits no edema or tenderness.  Lymphadenopathy:    She has no cervical adenopathy.  Neurological: She is alert and oriented to person, place, and time. She has normal strength. Gait normal.  Date: 12/?/2018   Skin: Skin is warm. No rash noted. No erythema.  Psychiatric: Her speech is normal. Her mood appears anxious.  Well groomed, good eye contact.    ASSESSMENT AND PLAN:   Candace Crawford was seen today for follow-up.  Diagnoses and all orders for this visit:  Hypertension, essential, benign  Adequately controlled. She acknowledges that she is forgetting to her medication a  few times, not sure about frequency.  She agrees with changing Metoprolol to Titrate to Metoprolol Succinate. We discussed a few strategies that she can implement in order to help with medication compliance. Some side effects discussed. BP check if possible.  -     metoprolol succinate (TOPROL-XL) 50 MG 24 hr tablet; Take 1 tablet (50 mg total) by mouth daily. Take with or immediately following a meal.  Transient alteration of awareness  Stable otherwise. Unclear etiology, ? Vascular dementia,depression among some. She will continue following with neurologist.  Weight loss, non-intentional  Since her last visit she has lost about 3 Lb. She is not concerned about this problem. Recommend setting up alarm on her phone or keeping a food diary. This is difficult for her to do given her Hx of memory lapses.  Depression, major, single episode, in partial remission ([place])  She does not think she is having symptoms. She is not interested in trying medication.    Apparently her sister has called at the end of the visit today and was upset because she did not receive a phone call during the visit. Explained to Candace Crawford that I will not be able to call her sister during or after every visit every time. I have called her before and we had difficulty reaching her. Ideally she should have a family member with her during the visits, which I understand it is challenging given the fact she has no family living in this area.  We have discussed the option of moving to assisted living,which has not been actively pursued.  Candace. Helderman agrees with mailing AVS to her sister after visits, so she states informed about changes.    Candace Lenig G. [place], MD  [facility]. [place] office.

## 2017-02-02 NOTE — Patient Instructions (Addendum)
A few things to remember from today's visit:   Hypertension, essential, benign - Plan: metoprolol succinate (TOPROL-XL) 50 MG 24 hr tablet  Metoprolol Succinate 500 mg started today . STOP Metoprolol Tartrate 100 mg.  No changes in Atorvastatin or Aspirin.  As discussed and after verbal consent from you I will be maling summery of visits to your sister. I will not be able to call her every visit unless there is a major change. Continue following with neurologist, Dr Tomi Likens, neuropsychiatric results still pending.   Use med box to help you taking med daily and avoid taking it 2 times.  Check blood pressure at home if possible.   Please be sure medication list is accurate. If a new problem present, please set up appointment sooner than planned today.

## 2017-02-25 NOTE — Progress Notes (Signed)
NEUROPSYCHOLOGICAL EVALUATION   Name:    Candace Crawford  Date of Birth:   1940/08/21 Date of Interview:  01/22/2017 Date of Testing:  01/22/2017   Date of Feedback:  02/27/2017       Background Information:  Reason for Referral:  Candace Crawford is a 77 y.o. female referred by Dr. Metta Clines to assess her current level of cognitive functioning and assist in differential diagnosis. The current evaluation consisted of a review of available medical records, an interview with the patient and sister, Candace Crawford, and the completion of a neuropsychological testing battery. Informed consent was obtained.  History of Presenting Problem:  Candace Crawford was seen by Dr. Tomi Likens for neurologic consultation on 10/24/2016 for transient loss of awareness. At that time she reported an episode of staring in January 2018 observed by family members as well as at least three subsequent episodes of memory lapses for whole events. She denied history of seizures,stroke,meningitis/encephalitis or head trauma. There is no known family history of dementia. 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 MoCA on 10/24/2016 was 17/30. EEG, neuropsychological testing and bloodwork were recommended. B12 was found to be low normal and 1065mg daily supplementation was recommended. EEG has not yet been scheduled. Per PCP notes, the patient's sister RShirlean Mylarhas expressed several concerns about changes in the patient, including short term memory loss and personality changes. (Per 11/28/2016 phone call, "She is very concerned about [name]. She states that she calls her daily, sometimes 2-3 times she speaks with her on the phone, and has noted drastic changes in her personality. She sometimes is "lucid" and can remember things/conversations but other times she is aggressive, "erratic" , and does not recall  prior conversations. She is thinking about taking "legal action" and [name] closely.")  During our clinical interview today (01/22/2017), the patient is lucid with normal communication and relatively linear thought process. However, she is a poor historian, sometimes contradicting herself. She allows her sister to provide informant report and does not seem upset or threatened by this. The patient reports she has had a few more episodes of memory lapses for events, but denies any daily memory difficulty - then later, she admits that she is having short term memory loss and frequently forgets she has had conversations. Her sister reports she first noticed memory problems when the patient was visiting Thanksgiving 2017. She has observed progressive memory decline over the past year. She speaks to the patient on the phone daily. She [place] The [place] family or friends here in [place] She moved her to be close to a friend who then apparently passed away. She has no social contact here. She wanted to find a church and apparently visited different churches each weekend and ultimately found one she really liked but never returned. She can't tell me why she never returned, and states "I can't even explain it to myself." [place]. [place] that [place], she had a conversation with her family members that hurt her and since then she does not feel any of her family, aside from [place] wants her in their lives.  Candace Crawford, on the other hand, states that the patient was the one to initiate the negative conversation and in fact was expressing to the rest of the family that she didn't want them in her life. It appears there have been several instances like this wherein the patient remembers recent events much differently than the other  individuals who were present. Of note, there was no history of significant family tension or conflict prior to that conversation 5 years ago.  The main cognitive concerns at this time are forgetfulness for recent conversations and events and frequent repeating of herself. She will call her sister 4 to 5 times a day and have no memory of having done so. They report occasional word finding difficulty. She is also much more confused when given multiple pieces of information. Appointment scheduling is particularly difficult for her. Her sister is now helping with this. She gets irritated and agitated if she is overwhelmed by information. She is having difficulty remembering to take her medications. She reports she thinks she is taking them correctly but then when she checks she sees she hasn't been. She is also forgetting to eat meals and not feeling hungry. She has lost weight. She has a long history of weight loss and poor food intake in the context of depression. She lives alone in an apartment. She apparently manages her finances with no problems. She does her own housekeeping and is keeping up with this. Hygiene is also appropriate and there have been no changes in self-care per her sister. She did stop driving, per Dr. Georgie Chard recommendation. Of note, there was a lot of controversy surrounding the sale of her car. She recalls the events differently than the people she was trying to sell it to. She actually filed a police report because she does not think she has been paid for the vehicle although a friend's boyfriend has the car. Her sister has spoken to the person who apparently is buying the car and they have a very different story. It is impossible for her sister to sort out what actually happened.  The patient admits, and her sister confirms, that she has a tendency to get very fixated on something and then has trouble paying attention to anything else. The patient also reports that she is very "routine"  and gets nervous about deviating from her routine.  Candace Crawford reports that the patient's cognitive symptoms are progressively worsening over time but are also fluctuating; on some days she is able to remember things that on other days she cannot.  She has not had any issues with balance or walking. She has not had any falls. She complains of knee pain to her sister.   She has a history of depression. She denies feeling particularly dysphoric at the present time but it appears her perception of her mood fluctuates. She endorses feelings of hopelessness and has thoughts of wishing she was dead or thinking "What's the point?" However, she denies suicidal ideation or intention. She has not had any hallucinations. She does drink alcohol, one glass of wine most days of the week. She denies history of heavy drinking or substance abuse. She is a daily cigarette smoker (10 cigarettes per day on average).   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 Marital history: Widowed. Children: None   Medical History:  Past Medical History:  Diagnosis Date  . Anemia   . Arthritis   . Chicken pox   .  Depression    no meds  . Diverticulitis   . Eating disorder   . Frequent headaches   . History of frequent urinary tract infections   . Hyperlipidemia   . Hypertension   . Migraines   . Postmenopausal bleeding 05/01/2014    Current medications:  Outpatient Encounter Medications as of 02/27/2017  Medication Sig  . aspirin EC 81 MG tablet Take 81 mg by mouth every morning.   Marland Kitchen atorvastatin (LIPITOR) 40 MG tablet Take 40 mg by mouth every morning.   . metoprolol succinate (TOPROL-XL) 50 MG 24 hr tablet Take 1 tablet (50 mg total) by mouth daily. Take with or immediately following a meal.  . Multiple Vitamin (MULTIVITAMIN WITH MINERALS) TABS tablet Take 1 tablet by mouth every morning.    No  facility-administered encounter medications on file as of 02/27/2017.      Current Examination:  Behavioral Observations:  Appearance: Neatly appropriately dressed and groomed Gait: Ambulated independently, no gross abnormalities observed Speech: Fluent; normal rate, rhythm and volume. Mild word finding difficulty. Comprehension appears intact. Thought process: Generally linear, goal directed Affect: Mildly blunted, does become tearful when speaking of her estrangement from some family members Interpersonal: Pleasant, appropriate The patient arrived 20 minutes late after having gone to the wrong office. Orientation: Oriented to person, place, current month and year. Disoriented to current day of the week and date. Accurately named the current President and his predecessor.   Tests Administered: . Test of Premorbid Functioning (TOPF) . Wechsler Adult Intelligence Scale-Fourth Edition (WAIS-IV): Similarities, Music therapist, Coding and Digit Span subtests . Wechsler Memory Scale-Fourth Edition (WMS-IV) Older Adult Version (ages 75-90): Logical Memory I, II and Recognition subtests  . [employer] Verbal Learning Test - 2nd Edition (CVLT-2) Short Form . Repeatable Battery for the Assessment of Neuropsychological Status (RBANS) Form A:  Figure Copy and Recall subtests and Semantic Fluency subtest . Neuropsychological Assessment Battery (NAB) Language Module, Form 1: Naming subtest . Boston Diagnostic Aphasia Examination: Complex Ideational Material subtest . Controlled Oral Word Association Test (COWAT) . Trail Making Test A and B . Clock drawing test . Geriatric Depression Scale (GDS) 15 Item . Generalized Anxiety Disorder - 7 item screener (GAD-7)  Test Results: Note: Standardized scores are presented only for use by appropriately trained professionals and to allow for any future test-retest comparison. These scores should not be interpreted without consideration of all the information that is  contained in the rest of the report. The most recent standardization samples from the test publisher or other sources were used whenever possible to derive standard scores; scores were corrected for age, gender, ethnicity and education when available.   Test Scores:  Test Name Raw Score Standardized Score Descriptor  TOPF 39/70 SS= 99 Average  WAIS-IV Subtests     Similarities 20/36 ss= 8 Low end of average  Block Design 29/66 ss= 10 Average  Coding 60/135 ss= 12 High average  Digit Span Forward 13/16 ss= 14 Superior  Digit Span Backward 7/16 ss= 9 Average  WMS-IV Subtests     LM I 22/53 ss= 7 Low average  LM II 1/39 ss= 2 Impaired  LM II Recognition 15/23 Cum %: 10-16   RBANS Subtests     Figure Copy 18/20 Z= 0.1 Average  Figure Recall 0/20 Z= -3 Severely impaired  Semantic Fluency 11 Z= -1.7 Borderline  CVLT-II Scores     Trial 1 4/9 Z= -1 Low average  Trial 4 7/9 Z= -0.5 Average  Trials 1-4 total  21/36 T= 39 Low average  SD Free Recall 4/9 Z= -1.5 Borderline  LD Free Recall 0/9 Z= -2 Impaired  LD Cued Recall 2/9 Z= -2.5 Impaired  Recognition Hits 8/9 Z= -0.5 Average  Recognition False Positives 4 Z= -1.5 Borderline  Forced Choice Recognition 9/9  WNL  NAB Naming 30/31 T= 58 High average  BDAE Complex Ideational Material 10/12  Mildly below expectation  COWAT-FAS 16 T= 29 Impaired  COWAT-Animals 17 T= 47 Average  Trail Making Test A  37" 0 errors T= 53 Average  Trail Making Test B  Pt unable   Impaired  Clock Drawing   Impaired  GDS-15 13/15  Severe  GAD-7 6/21  Mild      Description of Test Results:  Premorbid verbal intellectual abilities were estimated to have been within the average range based on a test of word reading. Psychomotor processing speed was high average. Basic auditory attention was high average, and more complex auditory attention (ie working memory) was average. Visual-spatial construction was average. Language abilities were largely intact.  Specifically, confrontation naming was high average, and semantic verbal fluency ranged from average (for animals) to borderline (for fruits/vegetables). Auditory comprehension of complex ideational material was mildly below expectation. With regard to verbal memory, encoding and acquisition of non-contextual information (i.e., word list) was low average. After a brief distracter task, free recall was borderline impaired (4/9 items recalled). After a delay, free recall was impaired (0/9 items recalled). Cued recall was impaired (2/9 items recalled). Performance on a yes/no recognition task was borderline impaired due to mildly increased false positive responses. On another verbal memory test, encoding and acquisition of contextual auditory information (i.e., short stories) was low average. After a delay, free recall was impaired. Performance on a yes/no recognition task was below expectation. With regard to non-verbal memory, delayed free recall of visual information was severely impaired. Executive functioning was variable. Mental flexibility and set-shifting were severely impaired; she was unable to complete Trails B. Verbal fluency with phonemic search restrictions was impaired. Verbal abstract reasoning was low end of average. Performance on a clock drawing task was mildly impaired (drew hand incorrect lengths). On a self-report measure of mood, the patient's responses were indicative of clinically significant depression at the present time. Symptoms endorsed included: dissatisfaction with life, dropping interests/activities, feeling life is empty, boredom, not feeling in good spirits, unhappiness, preferring to stay home, feelings of worthlessness, reduced energy, feelings of hopelessness. On a self-report measure of anxiety, the patient endorsed mild generalized anxiety characterized by nervousness, inability to control worrying, excessive worrying, difficulty relaxing, irritability and fear of something awful  happening.    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 Results of cognitive testing revealed deficits in memory consolidation and executive functioning. Additionally, there is evidence that her cognitive deficits are interfering with her ability to manage complex tasks such as her medications, appointments and the sale of her car. As such, diagnostic criteria for a dementia syndrome are met.  The patient's performance on memory testing is concerning for hippocampal consolidation dysfunction, and clinical features also suggest Alzheimer's disease. She also demonstrates executive dysfunction, but other frontal-subcortical cognitive functions are relatively intact, so I am less concerned about vascular dementia. Executive dysfunction can certainly be present in AD. Low vitamin B12 could be exacerbating cognitive dysfunction but I doubt it is the sole etiology. I do think there is a neurodegenerative process here, and if further testing (eg EEG) does not indicate alternative etiology,  AD is quite likely. Overall, at this time, based on testing results and current level of functioning, I would characterize her dementia as mild stage at this time. She also reports significant depression and anxiety, which could be related to dementia and personality changes, but she appears to have a remote and longstanding history of depression as well.   Recommendations/Plan: Based on the findings of the present evaluation, the following recommendations are offered:  1. I agree that the patient needs increased support/assistance/supervision, and the patient does not appear to have clear insight/awareness into her deficits. Additionally, she is at increased risk for financial exploitation and undue influence. I agree that it would be best for the patient to live in a more supportive environment - moving back close to relatives, if that is possible, is one  option. Otherwise, it seems she would need in-home assistance or assisted living. She will need increased assistance and supervision as her condition is expected to worsen over time. 2. At the present time, I recommend that all of her financial matters be handled by a trusted family Midwife. I recommend that her medications be administered to her daily. I recommend that she be accompanied to all appointments and that her appointments be managed by a trusted family member/caregiver. I recommend that she not return to driving (she has already sold her car).  3. She should assign POA for financial and health care decisions. 4. From a neuropsychological perspective, she appears to be an appropriate candidate for cholinesterase inhibitor therapy. She will follow up with Dr. Tomi Likens about this. She is scheduled to see him 03/12/2017. 5. She will function best with regular structure and routine, as well as social and mental stimulation. Attending a day health care program or at least senior center enrichment activities would likely be beneficial for her. 6. Her family will benefit from education and support regarding her diagnosis. They were referred to the Alzheimer[employer] ([email or web address]).    Feedback to Patient: Candace Crawford returned for a feedback appointment on 02/27/2017 to review the results of her neuropsychological evaluation with this provider. 40 minutes face-to-face time was spent reviewing her test results, my impressions and my recommendations as detailed above. We also called her sister, Candace Crawford, at the patient's request, to include her in the feedback session.  After Candace Crawford got off the phone, the patient expressed significant emotional distress surrounding her diagnosis. Emotional support was provided. She reported that if she gets worse cognitively, then she would "rather be dead". Further risk assessment was conducted. She denied suicidal intention or plan. She reported protective factors  including her faith and religious beliefs, and her sister Candace Crawford. She was advised to call 911 or go to the nearest emergency room if she develops suicidal ideation or thoughts of self-harm. She was agreeable to this. She was also agreeable to a referral to [facility] for supportive counseling surrounding her diagnosis. She is very overwhelmed emotionally right now but responded well to emotional support and was calm by the time our session was completed. Her sister is going to call her later today to check in with her and speak with her further. At the current time, the patient is deemed to be low risk for suicide but she does need ongoing monitoring, and fortunately she is amenable to behavioral health referral. The patient also reported that she was unsure about moving back to [place] and living near her family. She will discuss this further with her sister. She was provided with contact information for  [facility] [employer] and [employer] ([place]) in case she decides to look into local resources.   Total time spent on this patient's case: [id number] unit for interview with psychologist; [phone] unit of testing by psychometrician under psychologist's supervision; [phone] and [phone] units for integration of patient data, interpretation of standardized test results and clinical data, clinical decision making, treatment planning and preparation of this report, and interactive feedback with review of results to the patient/family by psychologist.      Thank you for your referral of [facility]. Please feel free to contact me if you have any questions or concerns regarding this report.

## 2017-02-27 ENCOUNTER — Ambulatory Visit (INDEPENDENT_AMBULATORY_CARE_PROVIDER_SITE_OTHER): Payer: Medicare Other | Admitting: Psychology

## 2017-02-27 ENCOUNTER — Encounter: Payer: Self-pay | Admitting: Psychology

## 2017-02-27 DIAGNOSIS — G301 Alzheimer's disease with late onset: Secondary | ICD-10-CM

## 2017-02-27 DIAGNOSIS — F331 Major depressive disorder, recurrent, moderate: Secondary | ICD-10-CM

## 2017-02-27 DIAGNOSIS — F0280 Dementia in other diseases classified elsewhere without behavioral disturbance: Secondary | ICD-10-CM

## 2017-02-27 NOTE — Patient Instructions (Signed)
Cognitive testing indicates the presence of dementia, and Alzheimer's disease is a likely cause of her dementia. Prominent deficits were noted in memory encoding and consolidation (forming new memories), as well as executive functioning (mental flexibility/set-shifting, for example). She also endorsed significant depression at the present time. Personality changes are likely due to dementia.  Recommendations/Plan: Based on the findings of the present evaluation, the following recommendations are offered:  1. I agree that the patient needs increased support/assistance/supervision, and the patient does not appear to have clear insight/awareness into her deficits. Additionally, she is at increased risk for financial exploitation and undue influence. I agree that it would be best for the patient to live close to relatives, if that is possible. Otherwise, it seems she would need in-home assistance or assisted living. She will need increased assistance and supervision as her condition is expected to worsen over time. 2. At the present time, I recommend that all of her financial matters be handled by a trusted family Midwife. I recommend that her medications be administered to her daily. I recommend that she be accompanied to all appointments and that her appointments be managed by a trusted family member/caregiver. I recommend that she not return to driving (she has already sold her car).  3. From a neuropsychological perspective, she appears to be an appropriate candidate for cholinesterase inhibitor therapy. She can follow up with Dr. Tomi Likens about this. 4. She will do best with regular structure and routine, as well as social and mental stimulation. Attending a day health care program or at least senior center enrichment activities would likely be a good fit for her. 5. Her family will benefit from education and support regarding her diagnosis. They are referred to the Alzheimer's [employer]  ([email or web address]).

## 2017-03-12 ENCOUNTER — Ambulatory Visit: Payer: Medicare Other | Admitting: Neurology

## 2017-03-12 ENCOUNTER — Telehealth: Payer: Self-pay | Admitting: Neurology

## 2017-03-12 ENCOUNTER — Encounter: Payer: Self-pay | Admitting: Neurology

## 2017-03-12 VITALS — BP 136/80 | HR 88 | Ht 66.0 in | Wt 142.0 lb

## 2017-03-12 DIAGNOSIS — R404 Transient alteration of awareness: Secondary | ICD-10-CM

## 2017-03-12 DIAGNOSIS — F329 Major depressive disorder, single episode, unspecified: Secondary | ICD-10-CM | POA: Diagnosis not present

## 2017-03-12 DIAGNOSIS — G301 Alzheimer's disease with late onset: Secondary | ICD-10-CM

## 2017-03-12 DIAGNOSIS — F0280 Dementia in other diseases classified elsewhere without behavioral disturbance: Secondary | ICD-10-CM

## 2017-03-12 DIAGNOSIS — F32A Depression, unspecified: Secondary | ICD-10-CM

## 2017-03-12 MED ORDER — DONEPEZIL HCL 5 MG PO TABS: 5.0000 mg | ORAL_TABLET | Freq: Every day | ORAL | 0 refills | Status: DC

## 2017-03-12 NOTE — Progress Notes (Signed)
NEUROLOGY FOLLOW UP OFFICE NOTE  Candace Crawford [id number]  HISTORY OF PRESENT ILLNESS: Candace Crawford is a 77 year old right-handed female with hypertension, hyperlipidemia, diverticulosis, cigarette smoker, migraine and depression who follows up for recurrent transient altered awareness.    UPDATE: B12 was 290.  It was recommended to start 1000 mcg oral daily. EEG was ordered, which was never performed.  She is not driving. 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  As other frontal-subcortical cognitive functions were relatively intact, vascular dementia seemed less likely.   HISTORY: In January 2018, she was visiting family in [place].  She has a tense relationship with her family.  She had an event witnessed by relatives who told her that she was staring and unresponsive for a few minutes.  She was amnestic to the event and doesn't recall several hours that day.  There was no associated headache, abnormal movements, tongue biting or incontinence.  A couple of months later she suddenly found herself outside at night taking out the trash.  She had a similar episode 2 weeks ago.  She had an amnestic episode in June, in which she received a call from her PCP's office and was told she showed up agitated and confused.  She has no recollection of this.  Her sister reportedly stated that she has dementia and is supposed to be on Aricept.  Ms. Steenbergen is not aware of this.   She lives by herself.  She denies disorientation on familiar routes but will take a taxi to unfamiliar places.  She says she manages her finances and pays bills without difficulty.  She takes her medication.   She denies history of seizures, stroke, meningitis/encephalitis or head trauma.  She has a history of an eating disorder.  She has depression and insomnia.  She previously lived in [place]  and is unhappy living here in [place] as she has no close friends or family nearby.  She moved to [place] about 2 years ago to live near her best friend, but her friend passed away shortly before her move.  However, she already had sold her house in [place].  She keeps to herself and has not found a social network.  She doesn't take to others well and feels that other people don't particularly like her.     She has 2 years of college.  There is no known family history of dementia.   MRI of brain with and without contrast from 09/07/16 revealed chronic small vessel ischemic changes in the cerebral white matter and pons but no acute stroke, bleed or mass lesion.  Carotid doppler from 09/06/16 demonstrated no hemodynamically significant ICA stenosis.   08/28/16:  TSH 1.12.  PAST MEDICAL HISTORY: Past Medical History:  Diagnosis Date  . Anemia   . Arthritis   . Chicken pox   . Depression    no meds  . Diverticulitis   . Eating disorder   . Frequent headaches   . History of frequent urinary tract infections   . Hyperlipidemia   . Hypertension   . Migraines   . Postmenopausal bleeding 05/01/2014    MEDICATIONS: Current Outpatient Medications on File Prior to Visit  Medication Sig Dispense Refill  . aspirin EC 81 MG tablet Take 81 mg by mouth every morning.     Marland Kitchen atorvastatin (LIPITOR) 40 MG tablet Take 40 mg by mouth every morning.     Marland Kitchen  metoprolol succinate (TOPROL-XL) 50 MG 24 hr tablet Take 1 tablet (50 mg total) by mouth daily. Take with or immediately following a meal. 90 tablet 1  . Multiple Vitamin (MULTIVITAMIN WITH MINERALS) TABS tablet Take 1 tablet by mouth every morning.      No current facility-administered medications on file prior to visit.     ALLERGIES: Allergies  Allergen Reactions  . Sulfa Antibiotics Other (See Comments)    Unknown reaction but patient was told not to take these drugs  . Penicillins Rash    FAMILY HISTORY: Family History  Problem  Relation Age of Onset  . Arthritis Mother   . Cancer Mother        uterine  . Hyperlipidemia Mother   . Heart disease Mother   . Hypertension Mother   . Arthritis Father   . Hyperlipidemia Father   . Hypertension Father     SOCIAL HISTORY: Social History   Socioeconomic History  . Marital status: Widowed    Spouse name: Not on file  . Number of children: Not on file  . Years of education: Not on file  . Highest education level: Not on file  Social Needs  . Financial resource strain: Not on file  . Food insecurity - worry: Not on file  . Food insecurity - inability: Not on file  . Transportation needs - medical: Not on file  . Transportation needs - non-medical: Not on file  Occupational History  . Occupation: retired  Tobacco Use  . Smoking status: Current Every Day Smoker    Packs/day: 0.50    Types: Cigarettes  . Smokeless tobacco: Never Used  Substance and Sexual Activity  . Alcohol use: Yes    Alcohol/week: 4.2 oz    Types: 7 Glasses of wine per week    Comment: one glass of wine most days a week  . Drug use: No  . Sexual activity: Yes    Birth control/protection: Post-menopausal  Other Topics Concern  . Not on file  Social History Narrative   Lives alone   1 story apartment   No pets    REVIEW OF SYSTEMS: Constitutional: No fevers, chills, or sweats, no generalized fatigue, change in appetite Eyes: No visual changes, double vision, eye pain Ear, nose and throat: No hearing loss, ear pain, nasal congestion, sore throat Cardiovascular: No chest pain, palpitations Respiratory:  No shortness of breath at rest or with exertion, wheezes GastrointestinaI: No nausea, vomiting, diarrhea, abdominal pain, fecal incontinence Genitourinary:  No dysuria, urinary retention or frequency Musculoskeletal:  No neck pain, back pain Integumentary: No rash, pruritus, skin lesions Neurological: as above Psychiatric: No depression, insomnia, anxiety Endocrine: No  palpitations, fatigue, diaphoresis, mood swings, change in appetite, change in weight, increased thirst Hematologic/Lymphatic:  No purpura, petechiae. Allergic/Immunologic: no itchy/runny eyes, nasal congestion, recent allergic reactions, rashes  PHYSICAL EXAM: Vitals:   03/12/17 1122  BP: 136/80  Pulse: 88  SpO2: 98%   General: No acute distress.  Patient appears well-groomed.   Head:  Normocephalic/atraumatic Eyes:  Fundi examined but not visualized Neurological Exam: alert and oriented to person, place, and time. Attention span and concentration intact, recent and remote memory intact, fund of knowledge intact.  Speech fluent and not dysarthric, language intact.  CN II-XII intact. Bulk and tone normal, muscle strength 5/5 throughout.  Sensation to light touch  intact.  Deep tendon reflexes 2+ throughout.  Finger to nose testing intact.  Gait normal, Romberg negative.  IMPRESSION: 1.  Alzheimer's disease,  mild 2.  Transient altered awareness.  May be symptom of dementia.  Seizure possible as well but less likely.  PLAN: 1.  Start Aricept 5mg  at bedtime for 1 month, then increase to 10mg  at bedtime. 2.  Discussed need for supportive environment.  I think she should live with family but she doesn't want to move back to [place].  I asked her to look into assisted living. 3.  Advised to verify if she has a POA for healthcare and financial matters. 4.  Smoking cessation 5.  Follow up in 6 months.  25 minutes spent face to face with patient, over 50% spent discussing management.   Metta Clines, DO  CC: Betty Martinique, MD

## 2017-03-12 NOTE — Patient Instructions (Signed)
1.  We will start donepezil (Aricept) 5mg  daily for four weeks.  If you are tolerating the medication, then after four weeks, we will increase the dose to 10mg  daily.  Side effects include nausea, vomiting, diarrhea, vivid dreams, and muscle cramps.  Please call the clinic if you experience any of these symptoms.  2.  There are a couple of national research studies locally regarding medications to slow progression of Alzheimer's.  I will give them your information and they will contact you.  You can decide if you would like to participate in the study.  3.  We will schedule EEG 4.  No driving 5.  I recommend considering to live near family again.  It is recommended that you have somebody overseeing your financial matters and administering your medication.  If not, I would like you to consider an assisted living facility (see below). 6.  Establish Power Of Attorney regarding healthcare and financial matters. 7.  Follow up in 6 months.  RESOURCES: Development worker, community of [facility]: [phone]  Tel [place]:  [phone]  www.senior-resources-[place].org/resources.cfm   Resources for common questions found under "Pathways & Protocols "  www.senior-resources-[place].org/pathways/Pathways_Menu.htm   Resources for [employer] [facility] and Assisted Living facilities:  www.ncnursinghomeguide.com   For assistance with senior care, elder law, and estate planning (POA, medical directives):  [employer]  [address], [place] [address]  Tel: [phone]  www.[email or web address]   Berneice Heinrich  Tel: [phone]  [email or web address]

## 2017-03-12 NOTE — Telephone Encounter (Signed)
Patient called back regarding her prescription being sent to [facility] instead of [facility]. She said it was called into [facility]. Thanks

## 2017-03-12 NOTE — Telephone Encounter (Signed)
Called Pt, advsd Rx sent to [facility] on [address].

## 2017-03-12 NOTE — Telephone Encounter (Signed)
Pt left a voicemail message asking for a call back but did not say why just wanted a call from Dr Tomi Likens

## 2017-03-21 ENCOUNTER — Telehealth: Payer: Self-pay | Admitting: Family Medicine

## 2017-03-21 NOTE — Telephone Encounter (Signed)
Left message for Bailey Mech to call back concerning information.

## 2017-03-21 NOTE — Telephone Encounter (Signed)
Copied from [place] [id number]. Topic: Inquiry >> Mar 21, 2017 12:41 PM [name] wrote: Reason for CRM:  Pt called and is requesting Dr Martinique call her sister about some information you gave the pt to look up, concerning her alzheimer's. Pt has lost the information, and hopes Dr Martinique will call the sister and give to her. Pt states she was going to try and do it, but got overwhelmed.  Now it is gone. Candace Crawford is the sister:  [phone]  Pt states no need to call her back, she will just loose or forget.

## 2017-03-26 NOTE — Telephone Encounter (Signed)
Tried [facility], unable to reach, will try again at a later date.

## 2017-03-28 NOTE — Telephone Encounter (Signed)
Tried [facility], unable to get in touch with her to find out what information she wanted. Left message for her to give clinic a call back.

## 2017-04-06 NOTE — Progress Notes (Signed)
Rcvd after hours telephone encounter. Pt called at 1:35am to request refills on atorvastatin 40mg  1QD, metoprolol tartrate 100mg  1QD send to [facility] on [address] [phone].  Rcvd second note from 3:12am-states Pt believes she's calling on a daily basis, and doesn't understand why she's doing it. She believes she's doing this in her head. Wants a callback from Dr Tomi Likens. Called Pt and LM on VM for her to rtrn my call

## 2017-04-10 ENCOUNTER — Telehealth: Payer: Self-pay | Admitting: Neurology

## 2017-04-10 NOTE — Telephone Encounter (Signed)
Pt left a message that she needs 3 prescriptions called in for her, she said 2 of which are not Dr Georgie Chard but she said he knows about them

## 2017-04-12 NOTE — Telephone Encounter (Signed)
Called Pt, LM for her to call me back

## 2017-04-20 ENCOUNTER — Telehealth: Payer: Self-pay | Admitting: Neurology

## 2017-04-20 ENCOUNTER — Other Ambulatory Visit: Payer: Self-pay | Admitting: Family Medicine

## 2017-04-20 MED ORDER — DONEPEZIL HCL 5 MG PO TABS: 5.0000 mg | ORAL_TABLET | Freq: Every day | ORAL | 1 refills | Status: DC

## 2017-04-20 NOTE — Telephone Encounter (Signed)
Patient has run out of her medication Donepezil last week. She is needing to change her pharmacy to [facility] The address is [place]. Thanks

## 2017-04-20 NOTE — Telephone Encounter (Signed)
Request for refill of Atorvastatin(Lipitor) 40mg . Not previously filled by Dr. Martinique.   LOV: 02/02/17  PCP: Dr. Martinique  Pharmacy: [facility] [address]   Notified pt's sister that she would need to contact [facility] in order to have the current prescription of Metoprolol succinate retrieved from [facility]. Understanding verbalized.

## 2017-04-20 NOTE — Telephone Encounter (Signed)
Copied from [place] [id number]. Topic: Quick Communication - Rx Refill/Question >> Apr 20, 2017  1:00 PM Synthia Innocent wrote: Medication: metoprolol succinate (TOPROL-XL) 50 MG 24 hr tablet and atorvastatin (LIPITOR) 40 MG tablet    Has the patient contacted their pharmacy? Yes, new pharmacy   (Agent: If no, request that the patient contact the pharmacy for the refill.)   Preferred Pharmacy (with phone number or street name): [place] [phone]   Agent: Please be advised that RX refills may take up to 3 business days. We ask that you follow-up with your pharmacy.

## 2017-04-20 NOTE — Telephone Encounter (Signed)
RX sent to [facility] on [address].

## 2017-04-24 MED ORDER — ATORVASTATIN CALCIUM 40 MG PO TABS: 40.0000 mg | ORAL_TABLET | Freq: Every morning | ORAL | 0 refills | Status: DC

## 2017-05-01 DIAGNOSIS — E785 Hyperlipidemia, unspecified: Secondary | ICD-10-CM | POA: Insufficient documentation

## 2017-05-01 NOTE — Progress Notes (Signed)
HPI:   [name] is a 77 y.o. female, who is here today for 3 months follow up.   She was last seen in 01/2017.  Since her last OV she has followed with neurologist for late onset Alzheimer's disease. She is currently on Aricept 5 mg daily, which she is not sure if she is still taking. She is still living by herself, her family lives in [place]. She is no longer driving.  Hypertension:   Currently on Metoprolol Succinate 50 mg daily.   She is taking medications as instructed, no side effects reported.  She has not noted unusual headache, visual changes, exertional chest pain, dyspnea,  focal weakness, or edema.   Lab Results  Component Value Date   CREATININE 1.18 08/28/2016   BUN 11 08/28/2016   NA 141 08/28/2016   K 4.5 08/28/2016   CL 105 08/28/2016   CO2 29 08/28/2016     Hyperlipidemia:  Currently on Atorvastatin 40 mg daily. Following a low fat diet: Yes.  She has not noted side effects with medication.   Weight loss: For the past few months he has been losing weight, most likely related to decreased food intake; forgetting to eat. She states that she does not feel hungry.  She keeps boiled eggs that she can eat when she is hungry. She states that she has "always been like things", not having a good appetite and skipping meals.  B12 deficiency: She is not taking B12 supplementation.  Lab Results  Component Value Date   [id number] 193 10/24/2016    Review of Systems  Constitutional: Negative for activity change, appetite change, fatigue and fever.  HENT: Negative for mouth sores, nosebleeds and trouble swallowing.   Eyes: Negative for redness and visual disturbance.  Respiratory: Negative for cough, shortness of breath and wheezing.   Cardiovascular: Negative for chest pain, palpitations and leg swelling.  Gastrointestinal: Negative for abdominal pain, nausea and vomiting.       Negative for changes in bowel habits.  Genitourinary:  Negative for decreased urine volume, dysuria and hematuria.  Musculoskeletal: Negative for gait problem and myalgias.  Neurological: Negative for seizures, syncope, weakness and headaches.  Psychiatric/Behavioral: Positive for confusion. Negative for sleep disturbance. The patient is nervous/anxious.      Current Outpatient Medications on File Prior to Visit  Medication Sig Dispense Refill  . aspirin EC 81 MG tablet Take 81 mg by mouth every morning.     Marland Kitchen atorvastatin (LIPITOR) 40 MG tablet Take 1 tablet (40 mg total) by mouth every morning. 90 tablet 0  . donepezil (ARICEPT) 5 MG tablet Take 1 tablet (5 mg total) by mouth at bedtime. 30 tablet 0  . metoprolol succinate (TOPROL-XL) 50 MG 24 hr tablet Take 1 tablet (50 mg total) by mouth daily. Take with or immediately following a meal. 90 tablet 1  . Multiple Vitamin (MULTIVITAMIN WITH MINERALS) TABS tablet Take 1 tablet by mouth every morning.      No current facility-administered medications on file prior to visit.      Past Medical History:  Diagnosis Date  . Anemia   . Arthritis   . Chicken pox   . Depression    no meds  . Diverticulitis   . Eating disorder   . Frequent headaches   . History of frequent urinary tract infections   . Hyperlipidemia   . Hypertension   . Migraines   . Postmenopausal bleeding 05/01/2014   Allergies  Allergen  Reactions  . Sulfa Antibiotics Other (See Comments)    Unknown reaction but patient was told not to take these drugs  . Penicillins Rash    Social History   Socioeconomic History  . Marital status: Widowed    Spouse name: None  . Number of children: None  . Years of education: None  . Highest education level: None  Social Needs  . Financial resource strain: None  . Food insecurity - worry: None  . Food insecurity - inability: None  . Transportation needs - medical: None  . Transportation needs - non-medical: None  Occupational History  . Occupation: retired  Tobacco Use    . Smoking status: Current Every Day Smoker    Packs/day: 0.50    Types: Cigarettes  . Smokeless tobacco: Never Used  Substance and Sexual Activity  . Alcohol use: Yes    Alcohol/week: 4.2 oz    Types: 7 Glasses of wine per week    Comment: one glass of wine most days a week  . Drug use: No  . Sexual activity: Yes    Birth control/protection: Post-menopausal  Other Topics Concern  . None  Social History Narrative   Lives alone   1 story apartment   No pets    Vitals:   05/02/17 1056  BP: 134/86  Pulse: 94  Temp: 97.6 F (36.4 C)  SpO2: 97%   Body mass index is 22.33 kg/m.  Wt Readings from Last 3 Encounters:  05/02/17 138 lb 6 oz (62.8 kg)  03/12/17 142 lb (64.4 kg)  02/02/17 144 lb 8 oz (65.5 kg)    Physical Exam  Nursing note and vitals reviewed. Constitutional: She appears well-developed and well-nourished. She is cooperative. No distress.  HENT:  Head: Normocephalic and atraumatic.  Mouth/Throat: Oropharynx is clear and moist and mucous membranes are normal.  Eyes: Conjunctivae are normal. Pupils are equal, round, and reactive to light.  Neck: No tracheal deviation present. No thyroid mass and no thyromegaly present.  Cardiovascular: Normal rate and regular rhythm.  No murmur heard. DP pulses present bilateral.  Respiratory: Effort normal and breath sounds normal. No respiratory distress.  GI: Soft. She exhibits no mass. There is no hepatomegaly. There is no tenderness.  Musculoskeletal: She exhibits no edema or tenderness.  Lymphadenopathy:    She has no cervical adenopathy.  Neurological: She is alert. She has normal strength. Gait normal.  Date: 04/2016.  She is oriented in person and place.  Skin: Skin is warm. No erythema.  Psychiatric: She has a normal mood and affect.  Well groomed, good eye contact.     ASSESSMENT AND PLAN:   [name] was seen today for 3 months follow-up.  Orders Placed This Encounter  Procedures  .  Comprehensive metabolic panel  . Lipid panel  . Vitamin B12    Hypertension, essential, benign Adequately controlled. No changes in current management. Low-salt diet to continue.. Eye exam recommended annually. F/U in 6 months, before if needed.   Hyperlipidemia She will continue Atorvastatin 40 mg daily. Low fat diet. Further recommendations will be given according to lab results. Follow-up in 6-12 months.  Alzheimer's dementia without behavioral disturbance She is not sure if she is taking Aricept, we went through all her medication and recommend taking all daily. A few tips given today that might help with keeping up with medications and meals. She really needs assistance but family is in [place] and she is not interested in relocating.  She continues losing  weight steadily. She will continue following with Dr. Tomi Likens.  B12 deficiency She is not on B12 supplementation. Further recommendation will be given according to lab results.  Encouraged smoking cessation, she does not think 1-2 cigarettes/day is "bad", she is not ready to quit smoking.  We discussed adverse effects of tobacco use.   -[name] was advised to return sooner than planned today if new concerns arise.       Leroy Trim G. Martinique, MD  [facility]. [place] office.

## 2017-05-02 ENCOUNTER — Ambulatory Visit (INDEPENDENT_AMBULATORY_CARE_PROVIDER_SITE_OTHER): Payer: Medicare Other | Admitting: Family Medicine

## 2017-05-02 ENCOUNTER — Encounter: Payer: Self-pay | Admitting: Family Medicine

## 2017-05-02 ENCOUNTER — Other Ambulatory Visit: Payer: Self-pay | Admitting: Family Medicine

## 2017-05-02 VITALS — BP 134/86 | HR 94 | Temp 97.6°F | Ht 66.0 in | Wt 138.4 lb

## 2017-05-02 DIAGNOSIS — G309 Alzheimer's disease, unspecified: Secondary | ICD-10-CM

## 2017-05-02 DIAGNOSIS — I10 Essential (primary) hypertension: Secondary | ICD-10-CM

## 2017-05-02 DIAGNOSIS — F0280 Dementia in other diseases classified elsewhere without behavioral disturbance: Secondary | ICD-10-CM | POA: Diagnosis not present

## 2017-05-02 DIAGNOSIS — E538 Deficiency of other specified B group vitamins: Secondary | ICD-10-CM | POA: Diagnosis not present

## 2017-05-02 DIAGNOSIS — E785 Hyperlipidemia, unspecified: Secondary | ICD-10-CM | POA: Diagnosis not present

## 2017-05-02 DIAGNOSIS — G301 Alzheimer's disease with late onset: Secondary | ICD-10-CM | POA: Diagnosis not present

## 2017-05-02 LAB — COMPREHENSIVE METABOLIC PANEL
ALBUMIN: 3.7 g/dL (ref 3.5–5.2)
ALK PHOS: 139 U/L — AB (ref 39–117)
ALT: 8 U/L (ref 0–35)
AST: 8 U/L (ref 0–37)
BILIRUBIN TOTAL: 0.3 mg/dL (ref 0.2–1.2)
BUN: 16 mg/dL (ref 6–23)
CALCIUM: 9.7 mg/dL (ref 8.4–10.5)
CO2: 31 mEq/L (ref 19–32)
Chloride: 106 mEq/L (ref 96–112)
Creatinine, Ser: 0.95 mg/dL (ref 0.40–1.20)
GFR: 73.76 mL/min (ref >60.00)
Glucose, Bld: 95 mg/dL (ref 70–99)
POTASSIUM: 3.9 meq/L (ref 3.5–5.1)
Sodium: 143 mEq/L (ref 135–145)
TOTAL PROTEIN: 7 g/dL (ref 6.0–8.3)

## 2017-05-02 LAB — LIPID PANEL
CHOLESTEROL: 215 mg/dL — AB (ref 0–200)
HDL: 47.4 mg/dL (ref >39.00)
LDL Cholesterol: 148 mg/dL — ABNORMAL HIGH (ref 0–99)
NONHDL: 167.41
TRIGLYCERIDES: 95 mg/dL (ref 0.0–149.0)
Total CHOL/HDL Ratio: 5
VLDL: 19 mg/dL (ref 0.0–40.0)

## 2017-05-02 LAB — VITAMIN B12: Vitamin B-12: 248 pg/mL (ref 211–911)

## 2017-05-02 NOTE — Assessment & Plan Note (Signed)
Adequately controlled. No changes in current management. Low salt diet to continue. Eye exam recommended annually. F/U in 6 months, before if needed.  

## 2017-05-02 NOTE — Telephone Encounter (Signed)
Copied from [place] [id number]. Topic: Quick Communication - Rx Refill/Question >> May 02, 2017  1:17 PM Wynetta Emery, [place] C wrote:   Medication:  atorvastatin (LIPITOR) 40 MG tablet  and also metoprolol succinate (TOPROL-XL) 50 MG 24 hr tablet   Has the patient contacted their pharmacy?no    (Agent: If no, request that the patient contact the pharmacy for the refill.)   Preferred Pharmacy (with phone number or street name): [place], [place]   Agent: Please be advised that RX refills may take up to 3 business days. We ask that you follow-up with your pharmacy.

## 2017-05-02 NOTE — Assessment & Plan Note (Signed)
She is not on B12 supplementation. Further recommendation will be given according to lab results.

## 2017-05-02 NOTE — Assessment & Plan Note (Addendum)
She is not sure if she is taking Aricept, we went through all her medication and recommend taking all daily. A few tips given today that might help with keeping up with medications and meals. She really needs assistance but family is in [place] and she is not interested in relocating.  She continues losing weight steadily. She will continue following with Dr. Tomi Likens.

## 2017-05-02 NOTE — Assessment & Plan Note (Signed)
She will continue Atorvastatin 40 mg daily. Low fat diet. Further recommendations will be given according to lab results. Follow-up in 6-12 months.

## 2017-05-02 NOTE — Patient Instructions (Addendum)
A few things to remember from today's visit:   Hypertension, essential, benign - Plan: Comprehensive metabolic panel  Hyperlipidemia, unspecified hyperlipidemia type - Plan: Comprehensive metabolic panel, Lipid panel  B12 deficiency - Plan: Vitamin B12  Wt Readings from Last 3 Encounters:  05/02/17 138 lb 6 oz (62.8 kg)  03/12/17 142 lb (64.4 kg)  02/02/17 144 lb 8 oz (65.5 kg)     Try to eat 3 meals daily at least. Mark in your calendar when you need to eat,so you can keep tract.  No changes today. Continue following with Dr Tomi Likens.    Please be sure medication list is accurate. If a new problem present, please set up appointment sooner than planned today.

## 2017-05-02 NOTE — Telephone Encounter (Addendum)
Pt will need to contact pharmacy for refills pt was provided with refills on Metoprolol to last until 07/2017 and recent refill of Atorvastatin was sent to the pharmacy on 04/24/17

## 2017-05-03 NOTE — Telephone Encounter (Signed)
Spoke with pt's sister Bailey Mech who states that the pt has not received prescription for Atorvastatin or Metoprolol.Advised sister to contact pharmacy in order to get medication from [facility], but the pt's sister states that the pt did contact [facility] in order to retreive medication from [facility], but was told that medication could not be retrieved.   [facility] contacted in order to see if atorvastatin had been received on 3/5, [facility] states that the current prescription is ready for pick up. Pharmacy also stating that they will be able to retreive Metoprolol from [facility].

## 2017-05-11 ENCOUNTER — Encounter: Payer: Self-pay | Admitting: *Deleted

## 2017-06-14 ENCOUNTER — Encounter: Payer: Self-pay | Admitting: Neurology

## 2017-07-26 ENCOUNTER — Emergency Department (HOSPITAL_COMMUNITY)
Admission: EM | Admit: 2017-07-26 | Discharge: 2017-07-26 | Disposition: A | Discharge status: Home / Self Care | Payer: Medicare Other | Attending: Emergency Medicine | Admitting: Emergency Medicine

## 2017-07-26 ENCOUNTER — Encounter (HOSPITAL_COMMUNITY): Payer: Self-pay | Admitting: Pharmacy Technician

## 2017-07-26 DIAGNOSIS — I10 Essential (primary) hypertension: Secondary | ICD-10-CM | POA: Diagnosis not present

## 2017-07-26 DIAGNOSIS — Z79899 Other long term (current) drug therapy: Secondary | ICD-10-CM | POA: Diagnosis not present

## 2017-07-26 DIAGNOSIS — F17210 Nicotine dependence, cigarettes, uncomplicated: Secondary | ICD-10-CM | POA: Diagnosis not present

## 2017-07-26 DIAGNOSIS — Z7982 Long term (current) use of aspirin: Secondary | ICD-10-CM | POA: Diagnosis not present

## 2017-07-26 DIAGNOSIS — R42 Dizziness and giddiness: Secondary | ICD-10-CM | POA: Diagnosis not present

## 2017-07-26 LAB — CBC WITH DIFFERENTIAL/PLATELET
Abs Immature Granulocytes: 0 10*3/uL (ref 0.0–0.1)
BASOS ABS: 0 10*3/uL (ref 0.0–0.1)
Basophils Relative: 0 %
EOS ABS: 0 10*3/uL (ref 0.0–0.7)
EOS PCT: 1 %
HEMATOCRIT: 42.9 % (ref 36.0–46.0)
Hemoglobin: 14 g/dL (ref 12.0–15.0)
Immature Granulocytes: 0 %
Lymphocytes Relative: 18 %
Lymphs Abs: 1.2 10*3/uL (ref 0.7–4.0)
MCH: 30.2 pg (ref 26.0–34.0)
MCHC: 32.6 g/dL (ref 30.0–36.0)
MCV: 92.7 fL (ref 78.0–100.0)
MONO ABS: 0.4 10*3/uL (ref 0.1–1.0)
Monocytes Relative: 6 %
Neutro Abs: 4.9 10*3/uL (ref 1.7–7.7)
Neutrophils Relative %: 75 %
Platelets: 183 10*3/uL (ref 150–400)
RBC: 4.63 MIL/uL (ref 3.87–5.11)
RDW: 14.8 % (ref 11.5–15.5)
WBC: 6.4 10*3/uL (ref 4.0–10.5)

## 2017-07-26 LAB — URINALYSIS, ROUTINE W REFLEX MICROSCOPIC
Bilirubin Urine: NEGATIVE
GLUCOSE, UA: NEGATIVE mg/dL
KETONES UR: NEGATIVE mg/dL
Leukocytes, UA: NEGATIVE
Nitrite: NEGATIVE
PROTEIN: NEGATIVE mg/dL
Specific Gravity, Urine: 1.008 (ref 1.005–1.030)
pH: 5 (ref 5.0–8.0)

## 2017-07-26 LAB — BASIC METABOLIC PANEL
Anion gap: 11 (ref 5–15)
BUN: 15 mg/dL (ref 6–20)
CO2: 24 mmol/L (ref 22–32)
CREATININE: 1.35 mg/dL — AB (ref 0.44–1.00)
Calcium: 9.2 mg/dL (ref 8.9–10.3)
Chloride: 108 mmol/L (ref 101–111)
GFR calc non Af Amer: 37 mL/min — ABNORMAL LOW (ref >60)
GFR, EST AFRICAN AMERICAN: 43 mL/min — AB (ref >60)
Glucose, Bld: 98 mg/dL (ref 65–99)
Potassium: 4 mmol/L (ref 3.5–5.1)
SODIUM: 143 mmol/L (ref 135–145)

## 2017-07-26 NOTE — Progress Notes (Signed)
CSW spoke with patient. Patient stated she wanted to go home. Patient a little confused about where her packages from [place] were at. Patient brought in from [place]. Patient called [facility] with CSW there. Pt's shopping items are at [facility]. CSW wrote that information on the top of pt's AVS. Pt stable ambulating and provided CSW with her address. CSW provided pt a taxi voucher.   Wendelyn Breslow, [facility]  [phone]

## 2017-07-26 NOTE — ED Provider Notes (Signed)
Medical screening examination/treatment/procedure(s) were conducted as a shared visit with non-physician practitioner(s) and myself.  I personally evaluated the patient during the encounter.  EKG Interpretation  Date/Time:  Thursday July 26 2017 17:22:18 EDT Ventricular Rate:  82 PR Interval:  122 QRS Duration: 78 QT Interval:  362 QTC Calculation: 422 R Axis:   50 Text Interpretation:  Normal sinus rhythm Biatrial enlargement Abnormal ECG no change from previous Confirmed by Charlesetta Shanks [id number]) on 07/26/2017 7:00:04 PM  She has history of mild Alzheimer's dementia but does have high baseline function.  She was at [facility] when she reportedly told the cashier that she felt dizzy and lowered herself to the floor.  Patient reports she does not recall that happening at all.  Patient is alert and nontoxic.  Her mental status is normal.  She is well appearance.  Ocular motions intact.  Heart regular.  Lungs clear.  Abdomen soft and nontender.  All movements are coordinated purposeful symmetric.  Patient is ambulatory without difficulty.  No motor deficits.  Cognitively, the patient seems normal.  Patient has no recall of the events that occurred.  Review of EMR identifies this as a recurrent and pre-existing presentation, designated as recurrent transient altered awareness in neurology consult note..  She has had MRI/carotid duplex last year without evidence of stroke or high risk.  Clinically, patient has no signs of cardiovascular dysfunction.  At this time, I do feel that this is a recurrence of similar episode.  It appears that when patient is not having these episodes she is very high functioning.  Patient wishes to go home and at this time I feel that is appropriate management.  Return precautions reviewed.   Charlesetta Shanks, MD 07/27/17 1435

## 2017-07-26 NOTE — ED Triage Notes (Signed)
Pt arrives via ems from home. Pt walked to [employer] and told cashier she was dizzy and lowered herself to the floor. Pt does not recall event. Pt forgetful. No hx dementia. VSS with EMS.  138/74 P 100 100% RA RR 20 CBG 179

## 2017-07-26 NOTE — ED Provider Notes (Signed)
[facility] EMERGENCY DEPARTMENT Provider Note   CSN: [id number] Arrival date & time: 07/26/17  1541     History   Chief Complaint Chief Complaint  Patient presents with  . Dizziness    HPI Candace Crawford is a 77 y.o. female with past medical history of Alzheimer's dementia with transient alteration of awareness, hypertension, hyperlipidemia, presenting to the ED via EMS with complaint of dizziness.  Or EMS, patient was at [facility] and complained to the cashier that Candace Crawford was feeling dizzy.  Candace Crawford then lowered herself to the floor and employee called EMS.  On EMS arrival, patient was disoriented and asking repetitive questions.  Patient states Candace Crawford recalls going to check out at [facility] and then has a lapse in memory until arrival to the ED.  Candace Crawford states Candace Crawford does not remember feeling dizzy, denies headache, vision changes, chest pain, palpitations, or any complaints currently in the ED.  Per chart review, patient evaluated by neurology in January of this year, who reports recurrent episodes of altered awareness where Candace Crawford has amnesia for a short period of time.  The history is provided by the patient, the EMS personnel and medical records.    Past Medical History:  Diagnosis Date  . Anemia   . Arthritis   . Chicken pox   . Depression    no meds  . Diverticulitis   . Eating disorder   . Frequent headaches   . History of frequent urinary tract infections   . Hyperlipidemia   . Hypertension   . Migraines   . Postmenopausal bleeding 05/01/2014    Patient Active Problem List   Diagnosis Date Noted  . Alzheimer's dementia without behavioral disturbance 05/02/2017  . Hyperlipidemia 05/01/2017  . B12 deficiency 01/02/2017  . Transient alteration of awareness 10/03/2016  . Hypertension, essential, benign 08/22/2016  . Insomnia, unspecified 08/22/2016  . Depression, major, single episode, in partial remission ([place]) 08/22/2016  . Postmenopausal bleeding 05/01/2014     Past Surgical History:  Procedure Laterality Date  . ABDOMINAL HYSTERECTOMY    . bilateral foot surgery     bunions  . COLONOSCOPY    . EYE SURGERY Bilateral    "unclogged my tear ducts"  . HYSTEROSCOPY W/D&C N/A 04/28/2014   surgery cancelled, patient ate, reschedule for 05/01/14  . HYSTEROSCOPY W/D&C N/A 05/01/2014   Procedure: CERVICAL DILATION ,HYSTEROSCOPY WITH UTERINE PERFORATION;  Surgeon: Aloha Gell, MD;  Location: [facility];  Service: Gynecology;  Laterality: N/A;  . ROBOTIC ASSISTED TOTAL HYSTERECTOMY WITH BILATERAL SALPINGO OOPHERECTOMY N/A 07/16/2014   Procedure: XI ROBOTIC ASSISTED TOTAL HYSTERECTOMY WITH BILATERAL SALPINGO OOPHORECTOMY ;  Surgeon: Everitt Amber, MD;  Location: [facility];  Service: Gynecology;  Laterality: N/A;     OB History   None      Home Medications    Prior to Admission medications   Medication Sig Start Date End Date Taking? Authorizing Provider  aspirin EC 81 MG tablet Take 81 mg by mouth every morning.    Yes [provider]  atorvastatin (LIPITOR) 40 MG tablet Take 1 tablet (40 mg total) by mouth every morning. 04/24/17  Yes Martinique, Betty G, MD  donepezil (ARICEPT) 5 MG tablet Take 1 tablet (5 mg total) by mouth at bedtime. 03/12/17  Yes Jaffe, Adam R, DO  metoprolol succinate (TOPROL-XL) 50 MG 24 hr tablet Take 1 tablet (50 mg total) by mouth daily. Take with or immediately following a meal. 02/02/17  Yes Martinique, Betty G, MD  Multiple Vitamin (MULTIVITAMIN  WITH MINERALS) TABS tablet Take 1 tablet by mouth every morning.    Yes [provider]    Family History Family History  Problem Relation Age of Onset  . Arthritis Mother   . Cancer Mother        uterine  . Hyperlipidemia Mother   . Heart disease Mother   . Hypertension Mother   . Arthritis Father   . Hyperlipidemia Father   . Hypertension Father     Social History Social History   Tobacco Use  . Smoking status: Current Every Day Smoker    Packs/day: 0.50     Types: Cigarettes  . Smokeless tobacco: Never Used  Substance Use Topics  . Alcohol use: Yes    Alcohol/week: 4.2 oz    Types: 7 Glasses of wine per week    Comment: one glass of wine most days a week  . Drug use: No     Allergies   Sulfa antibiotics and Penicillins   Review of Systems Review of Systems  Eyes: Negative for photophobia and visual disturbance.  Respiratory: Negative for shortness of breath.   Cardiovascular: Negative for chest pain and palpitations.  Gastrointestinal: Negative for abdominal pain.  Neurological: Positive for dizziness. Negative for syncope and headaches.  Hematological: Does not bruise/bleed easily.  Psychiatric/Behavioral: Positive for confusion.  All other systems reviewed and are negative.    Physical Exam Updated Vital Signs BP 119/67 (BP Location: Left Arm)   Pulse 79   Temp 98.3 F (36.8 C) (Oral)   Resp 17   Ht 5' 5.5" (1.664 m)   Wt 61.2 kg (135 lb)   SpO2 100%   BMI 22.12 kg/m   Physical Exam  Constitutional: Candace Crawford is oriented to person, place, and time. Candace Crawford appears well-developed and well-nourished. No distress.  HENT:  Head: Normocephalic and atraumatic.  Eyes: Conjunctivae are normal.  Neck: Normal range of motion. Neck supple.  Cardiovascular: Normal rate, regular rhythm and intact distal pulses.  Pulmonary/Chest: Effort normal and breath sounds normal. No respiratory distress.  Abdominal: Soft. Bowel sounds are normal. Candace Crawford exhibits no distension. There is no tenderness.  Neurological: Candace Crawford is alert and oriented to person, place, and time.  Mental Status:  Alert, oriented, thought content appropriate, able to give a coherent history. Speech fluent without evidence of aphasia. Able to follow 2 step commands without difficulty.  Cranial Nerves:  II:  Peripheral visual fields grossly normal, pupils equal, round, reactive to light III,IV, VI: ptosis not present, extra-ocular motions intact bilaterally  V,VII: smile  symmetric, facial light touch sensation equal VIII: hearing grossly normal to voice  X: uvula elevates symmetrically  XI: bilateral shoulder shrug symmetric and strong XII: midline tongue extension without fassiculations Motor:  Normal tone. 5/5 in upper and lower extremities bilaterally including strong and equal grip strength and dorsiflexion/plantar flexion Sensory: Pinprick and light touch normal in all extremities.  Deep Tendon Reflexes: 2+ and symmetric in the biceps and patella Cerebellar: normal finger-to-nose with bilateral upper extremities Gait: normal gait and balance CV: distal pulses palpable throughout    Skin: Skin is warm.  Psychiatric: Candace Crawford has a normal mood and affect. Her behavior is normal.  Nursing note and vitals reviewed.    ED Treatments / Results  Labs (all labs ordered are listed, but only abnormal results are displayed) Labs Reviewed  URINALYSIS, ROUTINE W REFLEX MICROSCOPIC - Abnormal; Notable for the following components:      Result Value   Hgb urine dipstick MODERATE (*)  Bacteria, UA RARE (*)    All other components within normal limits  BASIC METABOLIC PANEL - Abnormal; Notable for the following components:   Creatinine, Ser 1.35 (*)    GFR calc non Af Amer 37 (*)    GFR calc Af Amer 43 (*)    All other components within normal limits  CBC WITH DIFFERENTIAL/PLATELET    EKG EKG Interpretation  Date/Time:  Thursday July 26 2017 17:22:18 EDT Ventricular Rate:  82 PR Interval:  122 QRS Duration: 78 QT Interval:  362 QTC Calculation: 422 R Axis:   50 Text Interpretation:  Normal sinus rhythm Biatrial enlargement Abnormal ECG no change from previous Confirmed by Charlesetta Shanks [phone]) on 07/26/2017 7:00:04 PM   Radiology No results found.  Procedures Procedures (including critical care time)  Medications Ordered in ED Medications - No data to display   Initial Impression / Assessment and Plan / ED Course  I have reviewed the  triage vital signs and the nursing notes.  Pertinent labs & imaging results that were available during my care of the patient were reviewed by me and considered in my medical decision making (see chart for details).     Patient presenting to the ED via EMS for episode of dizziness.  Patient does have history of Alzheimer's dementia with documented episodes of altered awareness.  On exam patient is asymptomatic and states Candace Crawford feels fine for discharge.  Normal neurologic exam.  Normal heart sounds.  Labs ordered to rule out concerning causes of dizziness.  CBC within normal limits.  BMP with slight increase in creatinine though otherwise unremarkable.  UA without infection.  EKG without arrhythmia.  Patient reevaluated and reports Candace Crawford remains asymptomatic and is requesting discharge.  Patient discussed with and evaluated by Dr. Johnney Killian, who agrees no further work-up indicated at this time.  Patient's episode of confusion likely secondary to patient's documented history of Alzheimer's dementia.  No concerning cause of patient's dizziness.  Recommended close follow-up with PCP, recheck creatinine, and strict return precautions.  Pt is well-appearing and safe for discharge.  Discussed results, findings, treatment and follow up. Patient advised of return precautions. Patient verbalized understanding and agreed with plan.  Final Clinical Impressions(s) / ED Diagnoses   Final diagnoses:  Dizziness    ED Discharge Orders    None       Rie Mcneil, Martinique N, PA-C 07/26/17 2115    Charlesetta Shanks, MD 08/08/17 [phone]

## 2017-07-26 NOTE — Discharge Instructions (Signed)
Schedule an appointment with your primary care provider to follow-up on your visit today. Return to the ER for new or concerning symptoms.

## 2017-07-26 NOTE — ED Notes (Signed)
Case management provided pt with cab voucher

## 2017-07-26 NOTE — ED Notes (Signed)
Pt stable, ambulatory, states understanding of discharge instructions 

## 2017-07-30 ENCOUNTER — Telehealth: Payer: Self-pay | Admitting: Family Medicine

## 2017-07-30 ENCOUNTER — Ambulatory Visit (INDEPENDENT_AMBULATORY_CARE_PROVIDER_SITE_OTHER): Payer: Medicare Other | Admitting: Family Medicine

## 2017-07-30 ENCOUNTER — Encounter: Payer: Self-pay | Admitting: Family Medicine

## 2017-07-30 VITALS — BP 120/78 | HR 100 | Temp 98.0°F | Resp 12 | Ht 65.5 in | Wt 133.4 lb

## 2017-07-30 DIAGNOSIS — G301 Alzheimer's disease with late onset: Secondary | ICD-10-CM | POA: Diagnosis not present

## 2017-07-30 DIAGNOSIS — I10 Essential (primary) hypertension: Secondary | ICD-10-CM

## 2017-07-30 DIAGNOSIS — F0280 Dementia in other diseases classified elsewhere without behavioral disturbance: Secondary | ICD-10-CM

## 2017-07-30 DIAGNOSIS — F324 Major depressive disorder, single episode, in partial remission: Secondary | ICD-10-CM

## 2017-07-30 DIAGNOSIS — R944 Abnormal results of kidney function studies: Secondary | ICD-10-CM

## 2017-07-30 LAB — BASIC METABOLIC PANEL
BUN: 11 mg/dL (ref 6–23)
CO2: 31 mEq/L (ref 19–32)
Calcium: 9.5 mg/dL (ref 8.4–10.5)
Chloride: 102 mEq/L (ref 96–112)
Creatinine, Ser: 0.95 mg/dL (ref 0.40–1.20)
GFR: 73.71 mL/min (ref >60.00)
GLUCOSE: 100 mg/dL — AB (ref 70–99)
SODIUM: 141 meq/L (ref 135–145)

## 2017-07-30 NOTE — Telephone Encounter (Signed)
Copied from [place] [id number]. Topic: Quick Communication - See Telephone Encounter >> Jul 30, 2017  5:48 PM Rutherford Nail, NT wrote: CRM for notification. See Telephone encounter for: 07/30/17. Patient is calling and is confused. States that she was sitting watching tv and things started coming back to her from today. States that she remembers that she was seen, but not what was discussed. States that she currently feels fine and just wanted to call and let someone know that she remembers a little. Would like a call back to discuss any results or things she may need to know from visit on 07/30/17. Please advise.  CB#: [phone]

## 2017-07-30 NOTE — Assessment & Plan Note (Signed)
BP adequately controlled. She reports taking Metoprolol succinate 50 mg daily. Continue low-salt intake.

## 2017-07-30 NOTE — Progress Notes (Signed)
HPI:  Chief Complaint  Patient presents with  . Follow-up    ER Follow-up/dizziness    Candace Crawford is a 77 y.o. female, who is here today to follow on recent ED visit.   She was seen on 07/26/2017, she was taken to the ER by EMS. She appeared disoriented. She remembers event, states that she "passed out." She denies associated headache, chest pain, palpitations, diaphoresis, or focal deficit. She does not remember being dizzy.    Lab Results  Component Value Date   CREATININE 1.35 (H) 07/26/2017   BUN 15 07/26/2017   NA 143 07/26/2017   K 4.0 07/26/2017   CL 108 07/26/2017   CO2 24 07/26/2017   Lab Results  Component Value Date   WBC 6.4 07/26/2017   HGB 14.0 07/26/2017   HCT 42.9 07/26/2017   MCV 92.7 07/26/2017   PLT 183 07/26/2017   Brain MRI: 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 No acute abnormality.   States that she is feeling fine and has no concerns today. She is frustrated because she sometimes feels "confused", feels nervous and upset.  She states that this happens intermittently for "a long time."   She does not think she has problems with her memory and gets upset when her friends mentioned that she has been forgetful.  She does not recall when was the last time she spoke with her sister, who as I understand usually calls daily. History of Alzheimer's dementia, she states that she is taking Aricept 5 mg daily. She follows with Dr. Tomi Likens, she does not remember when her last appointment was.  She does not remember when was the last time she ate. States that she does not feel hungry, noted that her refrigerator is empty and planning to buy groceries but she forgets to do so. Not sure if she is drinking fluids.  She also has history of depression, I have recommended Celexa in the past but she did not pick it up.  Later during follow-up she did not think she needed antidepressant.  She mentions that before she  used to talk to people and enjoy it but now she is afraid of doing so. She feels safe at home.  She cried a few times during visit, she attributed to getting lost when she was coming to the appointment.  She took a, apparently the driver was new in the area and did not know the address.  She is also upset because her best friend Berenice Primas) is planning on moving to [place].  Another friend is also moving to [place], states that she feels "jealous" because they are going to be living close by.   During prior visits she has mentioned interest in moving to independent living facility and that she was thinking about applying.  She does not recall this conversation.    Review of Systems  Constitutional: Positive for appetite change. Negative for activity change, fatigue and fever.  HENT: Negative for mouth sores, nosebleeds and trouble swallowing.   Eyes: Negative for redness and visual disturbance.  Respiratory: Negative for cough, shortness of breath and wheezing.   Cardiovascular: Negative for chest pain, palpitations and leg swelling.  Gastrointestinal: Negative for abdominal pain, nausea and vomiting.       Negative for changes in bowel habits.  Genitourinary: Negative for decreased urine volume and hematuria.  Musculoskeletal: Negative for gait problem and myalgias.  Skin: Negative for pallor and rash.  Neurological: Negative for seizures,  weakness, numbness and headaches.  Psychiatric/Behavioral: Positive for confusion. Negative for hallucinations and sleep disturbance. The patient is nervous/anxious.       Current Outpatient Medications on File Prior to Visit  Medication Sig Dispense Refill  . aspirin EC 81 MG tablet Take 81 mg by mouth every morning.     Marland Kitchen atorvastatin (LIPITOR) 40 MG tablet Take 1 tablet (40 mg total) by mouth every morning. 90 tablet 0  . donepezil (ARICEPT) 5 MG tablet Take 1 tablet (5 mg total) by mouth at bedtime. 30 tablet 0  . metoprolol succinate  (TOPROL-XL) 50 MG 24 hr tablet Take 1 tablet (50 mg total) by mouth daily. Take with or immediately following a meal. 90 tablet 1  . Multiple Vitamin (MULTIVITAMIN WITH MINERALS) TABS tablet Take 1 tablet by mouth every morning.      No current facility-administered medications on file prior to visit.      Past Medical History:  Diagnosis Date  . Anemia   . Arthritis   . Chicken pox   . Depression    no meds  . Diverticulitis   . Eating disorder   . Frequent headaches   . History of frequent urinary tract infections   . Hyperlipidemia   . Hypertension   . Migraines   . Postmenopausal bleeding 05/01/2014   Allergies  Allergen Reactions  . Sulfa Antibiotics Other (See Comments)    Unknown reaction but patient was told not to take these drugs  . Penicillins Rash    Has patient had a PCN reaction causing immediate rash, facial/tongue/throat swelling, SOB or lightheadedness with hypotension: Yes Has patient had a PCN reaction causing severe rash involving mucus membranes or skin necrosis: No Has patient had a PCN reaction that required hospitalization: No Has patient had a PCN reaction occurring within the last 10 years: No If all of the above answers are "NO", then may proceed with Cephalosporin use.     Social History   Socioeconomic History  . Marital status: Widowed    Spouse name: Not on file  . Number of children: Not on file  . Years of education: Not on file  . Highest education level: Not on file  Occupational History  . Occupation: retired  Scientific laboratory technician  . Financial resource strain: Not on file  . Food insecurity:    Worry: Not on file    Inability: Not on file  . Transportation needs:    Medical: Not on file    Non-medical: Not on file  Tobacco Use  . Smoking status: Current Every Day Smoker    Packs/day: 0.50    Types: Cigarettes  . Smokeless tobacco: Never Used  Substance and Sexual Activity  . Alcohol use: Yes    Alcohol/week: 4.2 oz    Types: 7  Glasses of wine per week    Comment: one glass of wine most days a week  . Drug use: No  . Sexual activity: Yes    Birth control/protection: Post-menopausal  Lifestyle  . Physical activity:    Days per week: Not on file    Minutes per session: Not on file  . Stress: Not on file  Relationships  . Social connections:    Talks on phone: Not on file    Gets together: Not on file    Attends religious service: Not on file    Active member of club or organization: Not on file    Attends meetings of clubs or organizations: Not on file  Relationship status: Not on file  Other Topics Concern  . Not on file  Social History Narrative   Lives alone   1 story apartment   No pets    Vitals:   07/30/17 1210  BP: 120/78  Pulse: 100  Resp: 12  Temp: 98 F (36.7 C)  SpO2: 97%   Body mass index is 21.86 kg/m.  Wt Readings from Last 3 Encounters:  07/30/17 133 lb 6 oz (60.5 kg)  07/26/17 135 lb (61.2 kg)  05/02/17 138 lb 6 oz (62.8 kg)    Physical Exam  Nursing note and vitals reviewed. Constitutional: She appears well-developed and well-nourished. No distress.  HENT:  Head: Normocephalic and atraumatic.  Mouth/Throat: Oropharynx is clear and moist and mucous membranes are normal.  Eyes: Pupils are equal, round, and reactive to light. Conjunctivae and EOM are normal.  Cardiovascular: Normal rate and regular rhythm.  No murmur heard. Pulses:      Dorsalis pedis pulses are 2+ on the right side, and 2+ on the left side.  Respiratory: Effort normal and breath sounds normal. No respiratory distress.  GI: Soft. She exhibits no mass. There is no hepatomegaly. There is no tenderness.  Musculoskeletal: She exhibits no edema.  Lymphadenopathy:    She has no cervical adenopathy.  Neurological: She is alert. She has normal strength. Gait normal.  She is oriented in person and place.  Skin: Skin is warm. No rash noted. No erythema.  Psychiatric: Her mood appears anxious. Her affect is  labile.  Well groomed, good eye contact.    ASSESSMENT AND PLAN:  Ms. Jameila was seen today for follow-up.  Orders Placed This Encounter  Procedures  . Basic metabolic panel    Lab Results  Component Value Date   CREATININE 0.95 07/30/2017   BUN 11 07/30/2017   NA 141 07/30/2017   K 4.3 Hemolyzed 07/30/2017   CL 102 07/30/2017   CO2 31 07/30/2017     Hypertension, essential, benign BP adequately controlled. She reports taking Metoprolol succinate 50 mg daily. Continue low-salt intake.   Depression, major, single episode, in partial remission ([place]) I recommend trying Celexa but she does not think she needs pharmacologic treatment.    Alzheimer's dementia without behavioral disturbance She is declining. She thinks she can continue being independent but I think she needs to consider moving to a assisted living facility. She does not want to go back to [place], where her family is. I will try to arrange home health for evaluation and to assess living conditions.  Later on we might even need social service involved if family cannot move to the area and she continues declining.  Abnormal renal function test  Could be related to dehydration. Recommend keeping a diary or set alarms ,so she can remember to eat/drink something. Further recommendations will be given according to lab results.     12:21 pm to 1:04 pm  40 min face to face OV. > 50% was dedicated to discussion of Dx, prognosis, current living situation and the need of having family involve now. She does not want to become a burden for her family in [place] her sister Shirlean Mylar.She tells me that her sister's boyfriend died suddenly a few days ago, she was in the funeral.    Linde Wilensky G. Martinique, MD  [facility]. [place] office.

## 2017-07-30 NOTE — Assessment & Plan Note (Signed)
She is declining. She thinks she can continue being independent but I think she needs to consider moving to a assisted living facility. She does not want to go back to [place], where her family is. I will try to arrange home health for evaluation and to assess living conditions.  Later on we might even need social service involved if family cannot move to the area and she continues declining.

## 2017-07-30 NOTE — Assessment & Plan Note (Signed)
I recommend trying Celexa but she does not think she needs pharmacologic treatment.

## 2017-07-30 NOTE — Patient Instructions (Signed)
A few things to remember from today's visit:   Hypertension, essential, benign - Plan: Basic metabolic panel  Depression, major, single episode, in partial remission ([place])  Late onset Alzheimer's disease without behavioral disturbance  Abnormal renal function test - Plan: Basic metabolic panel  I will call you sister as we discussed to given an update. I will try to get somebody to go to your house to do physical therapy. We need to consider assisted living.  Please be sure medication list is accurate. If a new problem present, please set up appointment sooner than planned today.

## 2017-07-31 NOTE — Telephone Encounter (Signed)
Pt is calling back and still confused. Pt would like to speak with dr Martinique

## 2017-07-31 NOTE — Telephone Encounter (Signed)
Spoke with patient and went over lab work from yesterday, patient verbalized understanding.

## 2017-08-02 ENCOUNTER — Ambulatory Visit: Payer: Self-pay | Admitting: *Deleted

## 2017-08-02 ENCOUNTER — Encounter: Payer: Self-pay | Admitting: Family Medicine

## 2017-08-02 NOTE — Telephone Encounter (Signed)
error 

## 2017-08-02 NOTE — Telephone Encounter (Signed)
Pt called her provider's office to let her know that she has been sleep walking again. This has happened one other time this week. Denies symptoms of blurred vision, fever, abd pain or headache. Stated that she fell 2 days ago and hit tail bone, she assumes that she did. She can not remember what happened to her. She does not feel depressed.  She knows where she is and is oriented to time, place and person. Pt wants Dr. Martinique to be aware just in case she forgets to tell her at the next visit.  She would like Dr. Martinique to give her a call back please. Reason for Disposition . Nursing judgment  Protocols used: NO GUIDELINE OR REFERENCE AVAILABLE-A-AH

## 2017-08-02 NOTE — Telephone Encounter (Signed)
Will route and call to flow at [facility] at [facility].  Pt scheduled an appointment with Dr. Martinique on June 14 th at 9:45am.

## 2017-08-03 ENCOUNTER — Ambulatory Visit (INDEPENDENT_AMBULATORY_CARE_PROVIDER_SITE_OTHER): Payer: Medicare Other | Admitting: Family Medicine

## 2017-08-03 ENCOUNTER — Encounter: Payer: Self-pay | Admitting: Family Medicine

## 2017-08-03 VITALS — BP 122/80 | HR 104 | Temp 97.9°F | Resp 12 | Ht 65.5 in | Wt 135.1 lb

## 2017-08-03 DIAGNOSIS — R000 Tachycardia, unspecified: Secondary | ICD-10-CM

## 2017-08-03 DIAGNOSIS — F0280 Dementia in other diseases classified elsewhere without behavioral disturbance: Secondary | ICD-10-CM

## 2017-08-03 DIAGNOSIS — I10 Essential (primary) hypertension: Secondary | ICD-10-CM | POA: Diagnosis not present

## 2017-08-03 DIAGNOSIS — G301 Alzheimer's disease with late onset: Secondary | ICD-10-CM | POA: Diagnosis not present

## 2017-08-03 NOTE — Patient Instructions (Signed)
A few things to remember from today's visit:   Late onset Alzheimer's disease without behavioral disturbance  Sinus tachycardia   No changes in her medications today. Please be sure you are taking all your medications, you can place a sign on the wall to help you keeping track. Please be sure you eating and drinking fluids during the day.  Remember somebody is going to call you to arrange a home visit.   Please be sure medication list is accurate. If a new problem present, please set up appointment sooner than planned today.

## 2017-08-03 NOTE — Progress Notes (Signed)
ACUTE VISIT   HPI:  Chief Complaint  Patient presents with  . Memory issues    has periods where she forgets things sometimes    [name] is a 77 y.o. female, who is here today complaining of having difficulty with her memory. She has history of Alzheimer's disease and depression.  I saw [name] last week,07/30/17. She does remember being here but she is not sure when. She has no family in town,sister lives in [place].  She follows with Dr. Charlene Crawford and currently she is on Aricept 5 mg daily. Noted mild tachycardia. She is supposed to be on Metoprolol Succinate 50 mg daily. She is not sure if she is taking medication daily.  Last OV HH referral was placed, she has not received call.  She feels safe at home. She tells me that she is fine and she does not need help from her family.  She has not bought groceries yet. She states that she is drinking tea mainly, 3-4 times per day.  Still smoking,not interested in quitting.    Review of Systems  Constitutional: Negative for activity change, appetite change, fatigue and fever.  Eyes: Negative for redness and visual disturbance.  Respiratory: Negative for cough, shortness of breath and wheezing.   Cardiovascular: Negative for chest pain, palpitations and leg swelling.  Gastrointestinal: Negative for abdominal pain, nausea and vomiting.       Negative for changes in bowel habits.  Genitourinary: Negative for decreased urine volume, dysuria and hematuria.  Neurological: Negative for seizures, syncope, weakness and headaches.  Psychiatric/Behavioral: Positive for confusion. The patient is nervous/anxious.       Current Outpatient Medications on File Prior to Visit  Medication Sig Dispense Refill  . aspirin EC 81 MG tablet Take 81 mg by mouth every morning.     Marland Kitchen atorvastatin (LIPITOR) 40 MG tablet Take 1 tablet (40 mg total) by mouth every morning. 90 tablet 0  . donepezil (ARICEPT) 5 MG tablet Take 1 tablet (5  mg total) by mouth at bedtime. 30 tablet 0  . metoprolol succinate (TOPROL-XL) 50 MG 24 hr tablet Take 1 tablet (50 mg total) by mouth daily. Take with or immediately following a meal. 90 tablet 1  . Multiple Vitamin (MULTIVITAMIN WITH MINERALS) TABS tablet Take 1 tablet by mouth every morning.      No current facility-administered medications on file prior to visit.      Past Medical History:  Diagnosis Date  . Anemia   . Arthritis   . Chicken pox   . Depression    no meds  . Diverticulitis   . Eating disorder   . Frequent headaches   . History of frequent urinary tract infections   . Hyperlipidemia   . Hypertension   . Migraines   . Postmenopausal bleeding 05/01/2014   Allergies  Allergen Reactions  . Sulfa Antibiotics Other (See Comments)    Unknown reaction but patient was told not to take these drugs  . Penicillins Rash    Has patient had a PCN reaction causing immediate rash, facial/tongue/throat swelling, SOB or lightheadedness with hypotension: Yes Has patient had a PCN reaction causing severe rash involving mucus membranes or skin necrosis: No Has patient had a PCN reaction that required hospitalization: No Has patient had a PCN reaction occurring within the last 10 years: No If all of the above answers are "NO", then may proceed with Cephalosporin use.     Social History  Socioeconomic History  . Marital status: Widowed    Spouse name: Not on file  . Number of children: Not on file  . Years of education: Not on file  . Highest education level: Not on file  Occupational History  . Occupation: retired  Scientific laboratory technician  . Financial resource strain: Not on file  . Food insecurity:    Worry: Not on file    Inability: Not on file  . Transportation needs:    Medical: Not on file    Non-medical: Not on file  Tobacco Use  . Smoking status: Current Every Day Smoker    Packs/day: 0.50    Types: Cigarettes  . Smokeless tobacco: Never Used  Substance and Sexual  Activity  . Alcohol use: Yes    Alcohol/week: 4.2 oz    Types: 7 Glasses of wine per week    Comment: one glass of wine most days a week  . Drug use: No  . Sexual activity: Yes    Birth control/protection: Post-menopausal  Lifestyle  . Physical activity:    Days per week: Not on file    Minutes per session: Not on file  . Stress: Not on file  Relationships  . Social connections:    Talks on phone: Not on file    Gets together: Not on file    Attends religious service: Not on file    Active member of club or organization: Not on file    Attends meetings of clubs or organizations: Not on file    Relationship status: Not on file  Other Topics Concern  . Not on file  Social History Narrative   Lives alone   1 story apartment   No pets    Vitals:   08/03/17 0953  BP: 122/80  Pulse: (!) 104  Resp: 12  Temp: 97.9 F (36.6 C)  SpO2: 98%   Body mass index is 22.14 kg/m.  Wt Readings from Last 3 Encounters:  08/03/17 135 lb 2 oz (61.3 kg)  07/30/17 133 lb 6 oz (60.5 kg)  07/26/17 135 lb (61.2 kg)     Physical Exam  Nursing note and vitals reviewed. Constitutional: She appears well-developed. No distress.  HENT:  Head: Normocephalic and atraumatic.  Mouth/Throat: Oropharynx is clear and moist and mucous membranes are normal.  Eyes: Pupils are equal, round, and reactive to light. Conjunctivae are normal.  Cardiovascular: Regular rhythm. Tachycardia present.  No murmur heard. Pulses:      Dorsalis pedis pulses are 2+ on the right side, and 2+ on the left side.  Respiratory: Effort normal and breath sounds normal. No respiratory distress.  GI: Soft. She exhibits no mass. There is no hepatomegaly. There is no tenderness.  Musculoskeletal: She exhibits no edema.  Lymphadenopathy:    She has no cervical adenopathy.  Neurological: She is alert. She has normal strength. Gait normal.  She does not remember day of the week. Month: June, Year:2018 then she correct herself  2019.Season Spring. Oriented in place and person.  Skin: Skin is warm. No erythema.  Psychiatric: She has a normal mood and affect.  Well groomed, good eye contact.    ASSESSMENT AND PLAN:  [name] was seen today for memory issues.  Diagnoses and all orders for this visit:  Late onset Alzheimer's disease without behavioral disturbance  Remind her about her next appt with Dr Candace Crawford. Continue Aricept.  Sinus tachycardia  MIld. Asymptomatic. She needs to be more compliant with Metoprolol. HH evaluation pending to also  help with monitoring med intake.  Hypertension, essential, benign  Well controlled. No changes in Metoprolol Succinate 5 mg daily.     I called Ms Cassedy's sister and discussed her current status. I was authorized by Ms Tamala Julian. She is declining,losing wt due to poor oral intake. I think she needs to consider assisted living.  Ms Shirlean Mylar has a busy work schedule and it is difficult for her to come her but planning on taking a "long week" to come and take her to [place]. She is looking for assisted Living placement in [place], she would need to stay with her until she is able to apply to a facility. She doe snot have a date yet.       Kaniel Kiang G. Martinique, MD  [facility]. [place] office.

## 2017-08-06 ENCOUNTER — Telehealth: Payer: Self-pay | Admitting: Family Medicine

## 2017-08-06 NOTE — Telephone Encounter (Signed)
Copied from [place] [phone]. Topic: Quick Communication - See Telephone Encounter >> Aug 06, 2017  9:37 AM Bea Graff, NT wrote: CRM for notification. See Telephone encounter for: 08/06/17. Pt would like to speak with Dr. Martinique regarding she is having a lot of confusion today.

## 2017-08-06 NOTE — Telephone Encounter (Signed)
Pt having a lot of increased confusion Pt is concerned with her weight loss and that fact that she is not hungry so therefor does not eat.  Pt seems to be confused on what day it is but does recall being seen last week by Dr Martinique but could not remember all that was discussed.   Will send to Dr Martinique as Candace Crawford.

## 2017-08-06 NOTE — Telephone Encounter (Signed)
Notes   Candace Crawford 08/06/2017 11:33 AM  Summary: dr does not need to call     She is saying she is ok and said she will be calling her sister first and will determine if she needs to notify dr. From now on. So Doctor does not need to call her back today 6/17   Will still send to Dr Martinique as Juluis Rainier

## 2017-08-07 NOTE — Telephone Encounter (Signed)
No further assistance needed at this time.

## 2017-08-31 ENCOUNTER — Encounter: Payer: Self-pay | Admitting: Neurology

## 2017-08-31 ENCOUNTER — Ambulatory Visit: Payer: Medicare Other | Admitting: Neurology

## 2017-08-31 ENCOUNTER — Encounter

## 2017-08-31 VITALS — BP 150/100 | HR 100 | Ht 65.5 in | Wt 134.5 lb

## 2017-08-31 DIAGNOSIS — F0280 Dementia in other diseases classified elsewhere without behavioral disturbance: Secondary | ICD-10-CM

## 2017-08-31 DIAGNOSIS — R404 Transient alteration of awareness: Secondary | ICD-10-CM | POA: Diagnosis not present

## 2017-08-31 DIAGNOSIS — G301 Alzheimer's disease with late onset: Secondary | ICD-10-CM

## 2017-08-31 MED ORDER — DONEPEZIL HCL 10 MG PO TABS: 10.0000 mg | ORAL_TABLET | Freq: Every day | ORAL | 5 refills | Status: AC

## 2017-08-31 NOTE — Progress Notes (Signed)
NEUROLOGY FOLLOW UP OFFICE NOTE  Candace Crawford [id number]  HISTORY OF PRESENT ILLNESS: Candace Crawford is a 77 year old right-handed female with hypertension, hyperlipidemia, diverticulosis, cigarette smoker, migraine and depression who follows up for Alzheimer's disease.     UPDATE: She is taking Aricept 5mg  at bedtime.  She lives alone.  She says she is paying bills on schedule (such as the rent or utilities).  She is able to independently take medication with pill box.  She said she never heard from anyone about making a home health visit.     HISTORY: In January 2018, she was visiting family in [place].  She has a tense relationship with her family.  She had an event witnessed by relatives who told her that she was staring and unresponsive for a few minutes.  She was amnestic to the event and doesn't recall several hours that day.  There was no associated headache, abnormal movements, tongue biting or incontinence.  A couple of months later she suddenly found herself outside at night taking out the trash.  She had a similar episode 2 weeks ago.  She had an amnestic episode in June, in which she received a call from her PCP's office and was told she showed up agitated and confused.  She has no recollection of this.  Her sister reportedly stated that she has dementia and is supposed to be on Aricept.  Ms. Daily is not aware of this.   She lives by herself.  She denies disorientation on familiar routes but will take a taxi to unfamiliar places.  She says she manages her finances and pays bills without difficulty.  She takes her medication.   She denies history of seizures, stroke, meningitis/encephalitis or head trauma.  She has a history of an eating disorder.  She has depression and insomnia.  She previously lived in [place] and is unhappy living here in [place] as she has no close friends or family nearby.  She moved to [place] about 2 years ago to live near her best friend, but her  friend passed away shortly before her move.  However, she already had sold her house in [place].  She keeps to herself and has not found a social network.  She doesn't take to others well and feels that other people don't particularly like her.     She has 2 years of college.  There is no known family history of dementia.   Testing: 1)  MRI of brain with and without contrast from 09/07/16 revealed chronic small vessel ischemic changes in the cerebral white matter and pons but no acute stroke, bleed or mass lesion.  Carotid doppler from 09/06/16 demonstrated no hemodynamically significant ICA stenosis. 2)  LABs:  B12 was 290.  It was recommended to start 1000 mcg oral daily. 3)  EEG was ordered, which was never performed.  She is not driving. 4)  Neuropsychological testing on 01/22/17, which revealed deficits in memory consolidation and executive functioning, indicative of probable mild Alzheimer's dementia.  As other frontal-subcortical cognitive functions were relatively intact, vascular dementia seemed less likely  PAST MEDICAL HISTORY: Past Medical History:  Diagnosis Date  . Anemia   . Arthritis   . Chicken pox   . Depression    no meds  . Diverticulitis   . Eating disorder   . Frequent headaches   . History of frequent urinary tract infections   . Hyperlipidemia   . Hypertension   . Migraines   .  Postmenopausal bleeding 05/01/2014    MEDICATIONS: Current Outpatient Medications on File Prior to Visit  Medication Sig Dispense Refill  . aspirin EC 81 MG tablet Take 81 mg by mouth every morning.     Marland Kitchen atorvastatin (LIPITOR) 40 MG tablet Take 1 tablet (40 mg total) by mouth every morning. 90 tablet 0  . metoprolol succinate (TOPROL-XL) 50 MG 24 hr tablet Take 1 tablet (50 mg total) by mouth daily. Take with or immediately following a meal. 90 tablet 1  . Multiple Vitamin (MULTIVITAMIN WITH MINERALS) TABS tablet Take 1 tablet by mouth every morning.      No current  facility-administered medications on file prior to visit.     ALLERGIES: Allergies  Allergen Reactions  . Sulfa Antibiotics Other (See Comments)    Unknown reaction but patient was told not to take these drugs  . Penicillins Rash    Has patient had a PCN reaction causing immediate rash, facial/tongue/throat swelling, SOB or lightheadedness with hypotension: Yes Has patient had a PCN reaction causing severe rash involving mucus membranes or skin necrosis: No Has patient had a PCN reaction that required hospitalization: No Has patient had a PCN reaction occurring within the last 10 years: No If all of the above answers are "NO", then may proceed with Cephalosporin use.     FAMILY HISTORY: Family History  Problem Relation Age of Onset  . Arthritis Mother   . Cancer Mother        uterine  . Hyperlipidemia Mother   . Heart disease Mother   . Hypertension Mother   . Arthritis Father   . Hyperlipidemia Father   . Hypertension Father     SOCIAL HISTORY: Social History   Socioeconomic History  . Marital status: Widowed    Spouse name: Not on file  . Number of children: Not on file  . Years of education: Not on file  . Highest education level: Not on file  Occupational History  . Occupation: retired  Scientific laboratory technician  . Financial resource strain: Not on file  . Food insecurity:    Worry: Not on file    Inability: Not on file  . Transportation needs:    Medical: Not on file    Non-medical: Not on file  Tobacco Use  . Smoking status: Current Every Day Smoker    Packs/day: 0.50    Types: Cigarettes  . Smokeless tobacco: Never Used  Substance and Sexual Activity  . Alcohol use: Yes    Alcohol/week: 4.2 oz    Types: 7 Glasses of wine per week    Comment: one glass of wine most days a week  . Drug use: No  . Sexual activity: Yes    Birth control/protection: Post-menopausal  Lifestyle  . Physical activity:    Days per week: Not on file    Minutes per session: Not on file    . Stress: Not on file  Relationships  . Social connections:    Talks on phone: Not on file    Gets together: Not on file    Attends religious service: Not on file    Active member of club or organization: Not on file    Attends meetings of clubs or organizations: Not on file    Relationship status: Not on file  . Intimate partner violence:    Fear of current or ex partner: Not on file    Emotionally abused: Not on file    Physically abused: Not on file  Forced sexual activity: Not on file  Other Topics Concern  . Not on file  Social History Narrative   Lives alone   1 story apartment   No pets    REVIEW OF SYSTEMS: Constitutional: No fevers, chills, or sweats, no generalized fatigue, change in appetite Eyes: No visual changes, double vision, eye pain Ear, nose and throat: No hearing loss, ear pain, nasal congestion, sore throat Cardiovascular: No chest pain, palpitations Respiratory:  No shortness of breath at rest or with exertion, wheezes GastrointestinaI: No nausea, vomiting, diarrhea, abdominal pain, fecal incontinence Genitourinary:  No dysuria, urinary retention or frequency Musculoskeletal:  No neck pain, back pain Integumentary: No rash, pruritus, skin lesions Neurological: as above Psychiatric: No depression, insomnia, anxiety Endocrine: No palpitations, fatigue, diaphoresis, mood swings, change in appetite, change in weight, increased thirst Hematologic/Lymphatic:  No purpura, petechiae. Allergic/Immunologic: no itchy/runny eyes, nasal congestion, recent allergic reactions, rashes  PHYSICAL EXAM: Vitals:   08/31/17 1122  BP: (!) 150/100  Pulse: 100  SpO2: 99%   General: No acute distress.  Patient appears well-groomed.  normal body habitus. Head:  Normocephalic/atraumatic Eyes:  Fundi examined but not visualized Neck: supple, no paraspinal tenderness, full range of motion Heart:  Regular rate and rhythm Lungs:  Clear to auscultation bilaterally Back:  No paraspinal tenderness Neurological Exam: alert and oriented to person, place, and time. Attention span and concentration intact, recent and remote memory intact, fund of knowledge intact.  Speech fluent and not dysarthric, language intact.  CN II-XII intact. Bulk and tone normal, muscle strength 5/5 throughout.  Sensation to light touch  intact.  Deep tendon reflexes 2+ throughout.  Finger to nose testing intact.  Gait normal  IMPRESSION: Alzheimer's disease, mild Transient altered awareness.  Suspect sequelae of dementia.  I do not suspect seizure.  PLAN: Increase Aricept to 10mg  at bedtime Will order a Home Health Assessment visit Provided information for Alzheimer's disease resources, including phone number for the Alzheimer's Association Follow up in 6 months.  25 minutes spent face to face with patient, over 50% spent discussing  management.  Metta Clines, DO  CC: Dr. Martinique

## 2017-08-31 NOTE — Patient Instructions (Signed)
1.  I increased donepezil to 10mg  at bedtime 2.  We will order a home health assessment 3.  Follow up in 6 months

## 2017-09-26 ENCOUNTER — Telehealth: Payer: Self-pay | Admitting: Family Medicine

## 2017-09-26 NOTE — Telephone Encounter (Signed)
Message sent to Dr. Jordan for review. 

## 2017-09-26 NOTE — Telephone Encounter (Signed)
Copied from [place] [phone] [id number]. Topic: General - Other >> Sep 26, 2017  3:27 PM Lennox Solders wrote: Reason for DVO:UZHQU forrester NP with [facility] is calling the patient had abnormal PAD left is 0.6 and right is 1.06.

## 2017-10-01 NOTE — Telephone Encounter (Signed)
She has Hx of dementia and so far she has not reported claudication symptoms to me,so I do not believe I ordered ABI. Who ordered this procedure?  Report needs to be sent to ordering provider for further recommendations..  Thanks, BJ

## 2017-10-02 NOTE — Telephone Encounter (Signed)
Spoke with Jenny Reichmann and she stated that it is a required test that they do when they have to go out into the home and they are required to let the patient's PCP know of any abnormal results. No further assistance at this time.

## 2017-10-29 ENCOUNTER — Encounter: Payer: Self-pay | Admitting: Family Medicine

## 2017-10-29 ENCOUNTER — Ambulatory Visit (INDEPENDENT_AMBULATORY_CARE_PROVIDER_SITE_OTHER): Payer: Medicare Other | Admitting: Family Medicine

## 2017-10-29 VITALS — BP 132/84 | HR 100 | Temp 97.8°F | Resp 16 | Ht 65.5 in | Wt 137.4 lb

## 2017-10-29 DIAGNOSIS — G301 Alzheimer's disease with late onset: Secondary | ICD-10-CM | POA: Diagnosis not present

## 2017-10-29 DIAGNOSIS — E538 Deficiency of other specified B group vitamins: Secondary | ICD-10-CM

## 2017-10-29 DIAGNOSIS — E785 Hyperlipidemia, unspecified: Secondary | ICD-10-CM

## 2017-10-29 DIAGNOSIS — R7303 Prediabetes: Secondary | ICD-10-CM | POA: Diagnosis not present

## 2017-10-29 DIAGNOSIS — F324 Major depressive disorder, single episode, in partial remission: Secondary | ICD-10-CM

## 2017-10-29 DIAGNOSIS — I739 Peripheral vascular disease, unspecified: Secondary | ICD-10-CM | POA: Insufficient documentation

## 2017-10-29 DIAGNOSIS — F0280 Dementia in other diseases classified elsewhere without behavioral disturbance: Secondary | ICD-10-CM

## 2017-10-29 DIAGNOSIS — I10 Essential (primary) hypertension: Secondary | ICD-10-CM

## 2017-10-29 LAB — LIPID PANEL
CHOL/HDL RATIO: 4
Cholesterol: 186 mg/dL (ref 0–200)
HDL: 48.9 mg/dL (ref >39.00)
LDL Cholesterol: 108 mg/dL — ABNORMAL HIGH (ref 0–99)
NONHDL: 137.55
TRIGLYCERIDES: 147 mg/dL (ref 0.0–149.0)
VLDL: 29.4 mg/dL (ref 0.0–40.0)

## 2017-10-29 LAB — BASIC METABOLIC PANEL
BUN: 17 mg/dL (ref 6–23)
CALCIUM: 9.4 mg/dL (ref 8.4–10.5)
CO2: 32 meq/L (ref 19–32)
CREATININE: 0.98 mg/dL (ref 0.40–1.20)
Chloride: 104 mEq/L (ref 96–112)
GFR: 71.07 mL/min (ref >60.00)
Glucose, Bld: 82 mg/dL (ref 70–99)
Potassium: 4.2 mEq/L (ref 3.5–5.1)
Sodium: 142 mEq/L (ref 135–145)

## 2017-10-29 LAB — HEMOGLOBIN A1C: Hgb A1c MFr Bld: 6.2 % (ref 4.6–6.5)

## 2017-10-29 LAB — VITAMIN B12: Vitamin B-12: >1500 pg/mL — ABNORMAL HIGH (ref 211–911)

## 2017-10-29 MED ORDER — CYANOCOBALAMIN 1000 MCG/ML IJ SOLN
1000.0000 ug | Freq: Once | INTRAMUSCULAR | Status: AC
  Administered 2017-10-29: 1000 ug via INTRAMUSCULAR

## 2017-10-29 NOTE — Assessment & Plan Note (Signed)
Adequately controlled. No changes in current management. Low salt diet to continue. Eye exam recommended annually. F/U in 6 months, before if needed.  

## 2017-10-29 NOTE — Progress Notes (Signed)
HPI:   Candace Crawford is a 77 y.o. female, who is here today for follow up.   She was last seen on 08/03/17.      Hyperlipidemia:  Currently on Atorvastatin 40 mg daily. Following a low fat diet: Yes. She has not noted side effects with medication.  Lab Results  Component Value Date   CHOL 215 (H) 05/02/2017   HDL 47.40 05/02/2017   LDLCALC 148 (H) 05/02/2017   TRIG 95.0 05/02/2017   CHOLHDL 5 05/02/2017     B12 deficiency: She is not sure if she is taking B12 supplementation.  Lab Results  Component Value Date   VITAMINB12 248 05/02/2017   HTN:  She is on Metoprolol Succinate 50 mg daily,she think she is forgetting taking medication at least once per week.  Denies severe/frequent headache, visual changes, chest pain, dyspnea, palpitation, claudication, focal weakness, or edema.  09/26/17 NP with [facility] called to report abnormal ABI, L 0.6 and R 1.06. This was done as part of "required test" done at the time of home visit. She is still smoking, 5-6 cig daily and more when she is on the phone. Denies LE pain or cyanosis. She does not remember having home visit.   Review of Systems  Constitutional: Negative for appetite change, fatigue and fever.  HENT: Negative for mouth sores, nosebleeds and trouble swallowing.   Eyes: Negative for redness and visual disturbance.  Respiratory: Negative for cough, shortness of breath and wheezing.   Cardiovascular: Negative for chest pain, palpitations and leg swelling.  Gastrointestinal: Negative for abdominal pain, nausea and vomiting.       Negative for changes in bowel habits.  Genitourinary: Negative for decreased urine volume and hematuria.  Neurological: Negative for syncope, weakness and headaches.  Psychiatric/Behavioral: The patient is nervous/anxious.     Current Outpatient Medications on File Prior to Visit  Medication Sig Dispense Refill  . aspirin EC 81 MG tablet Take 81 mg by mouth every morning.     Marland Kitchen  atorvastatin (LIPITOR) 40 MG tablet Take 1 tablet (40 mg total) by mouth every morning. 90 tablet 0  . donepezil (ARICEPT) 10 MG tablet Take 1 tablet (10 mg total) by mouth at bedtime. 30 tablet 5  . metoprolol succinate (TOPROL-XL) 50 MG 24 hr tablet Take 1 tablet (50 mg total) by mouth daily. Take with or immediately following a meal. 90 tablet 1  . Multiple Vitamin (MULTIVITAMIN WITH MINERALS) TABS tablet Take 1 tablet by mouth every morning.      No current facility-administered medications on file prior to visit.      Past Medical History:  Diagnosis Date  . Anemia   . Arthritis   . Chicken pox   . Depression    no meds  . Diverticulitis   . Eating disorder   . Frequent headaches   . History of frequent urinary tract infections   . Hyperlipidemia   . Hypertension   . Migraines   . Postmenopausal bleeding 05/01/2014   Allergies  Allergen Reactions  . Sulfa Antibiotics Other (See Comments)    Unknown reaction but patient was told not to take these drugs  . Penicillins Rash    Has patient had a PCN reaction causing immediate rash, facial/tongue/throat swelling, SOB or lightheadedness with hypotension: Yes Has patient had a PCN reaction causing severe rash involving mucus membranes or skin necrosis: No Has patient had a PCN reaction that required hospitalization: No Has patient had a PCN reaction  occurring within the last 10 years: No If all of the above answers are "NO", then may proceed with Cephalosporin use.     Social History   Socioeconomic History  . Marital status: Widowed    Spouse name: Not on file  . Number of children: Not on file  . Years of education: Not on file  . Highest education level: Not on file  Occupational History  . Occupation: retired  Scientific laboratory technician  . Financial resource strain: Not on file  . Food insecurity:    Worry: Not on file    Inability: Not on file  . Transportation needs:    Medical: Not on file    Non-medical: Not on file    Tobacco Use  . Smoking status: Current Every Day Smoker    Packs/day: 0.50    Types: Cigarettes  . Smokeless tobacco: Never Used  Substance and Sexual Activity  . Alcohol use: Yes    Alcohol/week: 7.0 standard drinks    Types: 7 Glasses of wine per week    Comment: one glass of wine most days a week  . Drug use: No  . Sexual activity: Yes    Birth control/protection: Post-menopausal  Lifestyle  . Physical activity:    Days per week: Not on file    Minutes per session: Not on file  . Stress: Not on file  Relationships  . Social connections:    Talks on phone: Not on file    Gets together: Not on file    Attends religious service: Not on file    Active member of club or organization: Not on file    Attends meetings of clubs or organizations: Not on file    Relationship status: Not on file  Other Topics Concern  . Not on file  Social History Narrative   Lives alone   1 story apartment   No pets    Vitals:   10/29/17 1028  BP: 132/84  Pulse: 100  Resp: 16  Temp: 97.8 F (36.6 C)  SpO2: 96%   Body mass index is 22.51 kg/m.  Wt Readings from Last 3 Encounters:  10/29/17 137 lb 6 oz (62.3 kg)  08/31/17 134 lb 8 oz (61 kg)  08/03/17 135 lb 2 oz (61.3 kg)     Physical Exam  Nursing note and vitals reviewed. Constitutional: She appears well-developed and well-nourished. No distress.  HENT:  Head: Normocephalic and atraumatic.  Mouth/Throat: Oropharynx is clear and moist and mucous membranes are normal.  Eyes: Pupils are equal, round, and reactive to light. Conjunctivae are normal.  Cardiovascular: Normal rate and regular rhythm.  No murmur heard. DP and PT pulses present bilateral.  Respiratory: Effort normal and breath sounds normal. No respiratory distress.  GI: Soft. She exhibits no mass. There is no hepatomegaly. There is no tenderness.  Musculoskeletal: She exhibits no edema.  Lymphadenopathy:    She has no cervical adenopathy.  Neurological: She is  alert. She has normal strength. No cranial nerve deficit. Gait normal.  Oriented in person and place. Disoriented in time,except for year (2019). 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  Skin: Skin is warm. No rash noted. No erythema.  Psychiatric: Her mood appears anxious. Her affect is labile.  Well groomed, good eye contact.       ASSESSMENT AND PLAN:   Candace Crawford was seen today for follow-up.  Orders Placed This Encounter  Procedures  . Basic metabolic panel  . Lipid panel  . Hemoglobin A1c  .  Vitamin B12   Lab Results  Component Value Date   VITAMINB12 >1500 (H) 10/29/2017   Lab Results  Component Value Date   HGBA1C 6.2 10/29/2017   Lab Results  Component Value Date   CHOL 186 10/29/2017   HDL 48.90 10/29/2017   LDLCALC 108 (H) 10/29/2017   TRIG 147.0 10/29/2017   CHOLHDL 4 10/29/2017   Lab Results  Component Value Date   CREATININE 0.98 10/29/2017   BUN 17 10/29/2017   NA 142 10/29/2017   K 4.2 10/29/2017   CL 104 10/29/2017   CO2 32 10/29/2017    Hypertension, essential, benign Adequately controlled. No changes in current management. Low salt diet to continue. Eye exam recommended annually. F/U in 6 months, before if needed.   Hyperlipidemia No changes in current management, will follow labs done today and will give further recommendations accordingly. F/U in 6-12 months.  Depression, major, single episode, in partial remission ([place]) She does not think she is depressed and she is not interested in starting a new medication.  B12 deficiency Here today and after verbal consent she received B12 1000 mcg IM. Further recommendations will be given according to B12 results.  Prediabetes Primary prevention through a healthy life style recommended.   PAD (peripheral artery disease) (H[facility]) Pulses present bilateral. She denies symptoms, she is not interested in repeating test,does not remember having one before. Continue Aspirin 81 mg daily and  Atorvastatin. Strongly recommend smoking cessation. Instructed about warning signs.   Late onset Alzheimer's disease without behavioral disturbance  Seems stable otherwise. Continue following with Dr Charlene Brooke. I will try to contact sister to give her an update,she authorized me to do so but concerned because she is busy and dealing with some stress.    Candace G. Martinique, MD  [facility]. [place] office.

## 2017-10-29 NOTE — Assessment & Plan Note (Signed)
She does not think she is depressed and she is not interested in starting a new medication.

## 2017-10-29 NOTE — Assessment & Plan Note (Signed)
No changes in current management, will follow labs done today and will give further recommendations accordingly. F/U in 6-12 months.  

## 2017-10-29 NOTE — Patient Instructions (Addendum)
A few things to remember from today's visit:   Hypertension, essential, benign - Plan: Basic metabolic panel  Hyperlipidemia, unspecified hyperlipidemia type - Plan: Lipid panel  Prediabetes - Plan: Hemoglobin A1c  Late onset Alzheimer's disease without behavioral disturbance  Depression, major, single episode, in partial remission ([place])  B12 deficiency - Plan: Vitamin B12    [employer] could deliver groceries. I will find out if somebody can help you with groceries, [place] agencies.  I would be calling your sister to give her an update, we discussed this and you agreed with doing so.  No changes in your medications today. If your cholesterol is still elevated we will increase atorvastatin dose.   Please keep your next appointment with neurologist.    Please be sure medication list is accurate. If a new problem present, please set up appointment sooner than planned today.

## 2017-10-29 NOTE — Assessment & Plan Note (Signed)
Pulses present bilateral. She denies symptoms, she is not interested in repeating test,does not remember having one before. Continue Aspirin 81 mg daily and Atorvastatin. Strongly recommend smoking cessation. Instructed about warning signs.

## 2017-10-29 NOTE — Assessment & Plan Note (Signed)
Here today and after verbal consent she received B12 1000 mcg IM. Further recommendations will be given according to B12 results.

## 2017-10-29 NOTE — Assessment & Plan Note (Signed)
Primary prevention through a healthy life style recommended.

## 2017-11-02 ENCOUNTER — Other Ambulatory Visit: Payer: Self-pay | Admitting: *Deleted

## 2017-11-02 MED ORDER — ATORVASTATIN CALCIUM 80 MG PO TABS: 80.0000 mg | ORAL_TABLET | Freq: Every day | ORAL | 3 refills | Status: AC

## 2017-11-08 ENCOUNTER — Telehealth: Payer: Self-pay

## 2017-11-08 NOTE — Telephone Encounter (Signed)
Please advise 

## 2017-11-08 NOTE — Telephone Encounter (Signed)
Patient Name: Candace Crawford Gender: Female DOB: 1940/10/31 Age: 77 Y 95 M 14 D Return Phone Number: [phone] (Primary) Address: City/State/Zip: [place] [address] Client [facility] Night - Client Client Site [place] - Night Physician Martinique, Betty - MD Contact Type Call Who Is Calling Patient / Member / Family / Caregiver Call Type Triage / Clinical Relationship To Patient Self Return Phone Number [phone] (Primary) Chief Complaint CONFUSION - new onset Reason for Call Symptomatic / Request for Health Information Initial Comment Caller states she dozed off and just realized she cannot recall anything that has happened or was discussed at her visits to her doctor. Translation No Nurse Assessment Nurse: Harlow Mares, RN, Suanne Marker Date/Time (Eastern Time): 11/07/2017 7:27:42 PM Confirm and document reason for call. If symptomatic, describe symptoms. ---Caller states she dozed off and just realized she cannot recall anything that has happened or was discussed at her visits to her doctor. Reports that she was seen within the last few days and doesn't recall getting into the cab and going to the MD office or what was discussed. She is not sure what happens when she goes to the MD. Comments User: Tamala Fothergill, RN Date/Time Eilene Ghazi Time): 11/07/2017 7:38:58 PM Caller reports that she prefers to see her MD tomorrow. Advised the caller to call PCP office in the am to schedule.

## 2017-11-20 ENCOUNTER — Telehealth: Payer: Self-pay | Admitting: Family Medicine

## 2017-11-20 NOTE — Telephone Encounter (Signed)
Left message for patient to return call.

## 2017-11-20 NOTE — Telephone Encounter (Signed)
Copied from [place] [id number]. Topic: Quick Communication - See Telephone Encounter >> Nov 20, 2017  1:05 PM Antonieta Iba C wrote: CRM for notification. See Telephone encounter for: 11/20/17.  Pt called in requesting a call back from provider. Pt says that she feels anxious/nervous. I asked pt if something happened to cause her to feel this way. Pt says no she gets this feeling occasionally off and on. Pt declined speaking with a nurse. Pt says that she also doesn't want to be prescribed a medication.   CB:   [phone]

## 2017-11-20 NOTE — Telephone Encounter (Signed)
Pt returned call and would like call back as soon as possible tomorrow (Wed).

## 2017-11-20 NOTE — Telephone Encounter (Signed)
Message sent to Dr. Jordan for review. 

## 2017-11-20 NOTE — Telephone Encounter (Signed)
Can you please call pt and address her concerns. Then please contact sister in [place] to convey information to her.  Thanks, BJ

## 2017-11-21 NOTE — Telephone Encounter (Signed)
Spoke with patient and she stated that she lives by herself and sometimes she get confused and feel a little anxious and nervous. Patient stated that it happens a few times a day. Patient stated that she does not remember what may have prompted her to call in and say that, but she is fine today.

## 2017-11-27 NOTE — Telephone Encounter (Signed)
I tried to contact sister to discuss Candace. Crawford's current situation.   She has called twice concerned about confusion. Candace Crawford has authorized me to keep her sister up to date after visits.  I have strongly recommended to have a relative living with Candace. Crawford or for her to move with her sister in [place]. Episodes of confusion seems to be getting worse. She is following with neurology.  I left a message and I will try to contact her later today or tomorrow.  Geraline Halberstadt Martinique, MD

## 2017-12-21 ENCOUNTER — Telehealth: Payer: Self-pay | Admitting: Family Medicine

## 2017-12-21 NOTE — Telephone Encounter (Signed)
Copied from [place] [phone]. Topic: General - Other >> Dec 21, 2017 10:36 AM Margot Ables wrote: Reason for CRM: pts sister, Bailey Mech, is here in [place] and will be taking pt back to [place] with her full time. She is requesting a call back from Dr. Betty Martinique to discuss her health prior to returning to [place]. They are leaving Sunday. If not able to call before please call when available.

## 2017-12-21 NOTE — Telephone Encounter (Signed)
He spoke with Ms. Bailey Mech, she is taking Ms. Glasner to live with her in [place]. She wanted a list of her medications. We went through problem list and current medication + indication. She needs to establish with new PCP and neurologist in [facility]. We will be sending copy of records to new provider.  Markie Heffernan Martinique, MD

## 2017-12-31 ENCOUNTER — Telehealth: Payer: Self-pay | Admitting: Neurology

## 2017-12-31 NOTE — Telephone Encounter (Signed)
Patient's daughter is calling in stating that she needs a letter that she can't be by herself because of her alzheimers. She has moved her back to [place] to live with her. Please call her back at [phone]. Thanks!

## 2018-01-01 NOTE — Telephone Encounter (Signed)
Called and LMOVM for Robyn to return my call

## 2018-01-02 ENCOUNTER — Telehealth: Payer: Self-pay | Admitting: Neurology

## 2018-01-02 NOTE — Telephone Encounter (Signed)
Yes

## 2018-01-02 NOTE — Telephone Encounter (Signed)
Candace Crawford returned my call. She has had to take Candace Crawford with her back to [place] to live. She is unable to live by herself any longer. Pt was getting lost, forgetting to eat and endangering herself.  Candace Crawford needs a simple letter stating Pt has alzheimers and is unable to live alone to provide to the Pt's apartment office so she will not be charged for terminating her lease early.  Mail to : [phone] [place] [address]

## 2018-01-02 NOTE — Telephone Encounter (Signed)
Candace Crawford left a vm that she was returning your call. Please call her back at [phone]. Thanks!

## 2018-01-10 ENCOUNTER — Telehealth: Payer: Self-pay | Admitting: Neurology

## 2018-01-10 NOTE — Telephone Encounter (Signed)
Called and advise the letter has been mailed

## 2018-01-10 NOTE — Telephone Encounter (Signed)
Patient's POA is needing a letter sent to the living place she is at to state that she is no longer able to live alone. Please call her it's [phone]. Thanks!

## 2018-03-04 ENCOUNTER — Ambulatory Visit: Payer: Medicare Other | Admitting: Neurology

## 2018-04-29 ENCOUNTER — Ambulatory Visit: Payer: Medicare Other | Admitting: Family Medicine

## 2018-07-16 IMAGING — MR MR HEAD WO/W CM
13 series · 48 of 48 positions shown · IV contrast (16 ML MULTIHANCE)
Comparison: None.

CLINICAL DATA: Transient alteration of awareness

EXAM:
MRI HEAD WITHOUT AND WITH CONTRAST
TECHNIQUE: Multiplanar, multiecho pulse sequences of the brain and surrounding
structures were obtained without and with intravenous contrast.
CONTRAST:  16mL MULTIHANCE GADOBENATE DIMEGLUMINE 529 MG/ML IV SOLN

[Series 5: T1 · sagittal · 4.0mm · 0.75mm/px · 1 of 31 slices shown (1 of 3)]
[im 1/31]
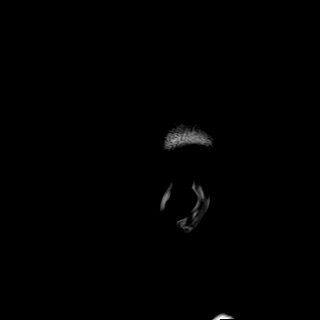

[Series 6: FLAIR · sagittal · 4.0mm · 0.72mm/px · 1 of 25 slices shown (1 of 2)]
[im 1/25]
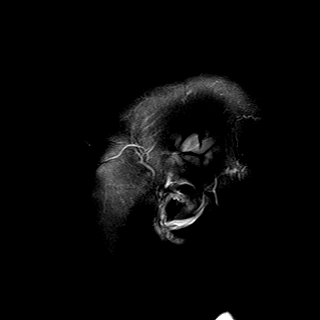

[Series 7: DWI · coronal · 5.0mm · 1.44mm/px · 4 of 60 slices shown (1 of 4)]
[im 1/60]
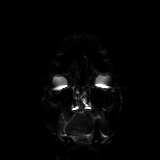
[im 20/60]
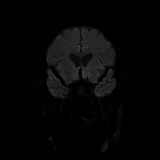
[im 40/60]
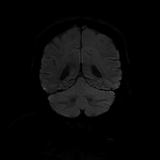
[im 60/60]
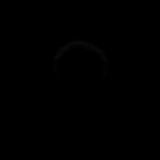

[Series 8: DWI · coronal · 5.0mm · 1.44mm/px · 2 of 30 slices shown (2 of 4)]
[im 1/30]
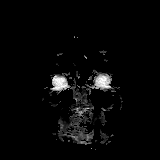
[im 30/30]
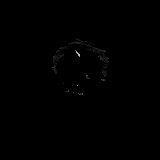

[Series 9: DWI · axial · 3.0mm · 1.44mm/px · z∈[-60,+75]mm · 5 of 84 slices shown (3 of 4)]
[im 1/84]
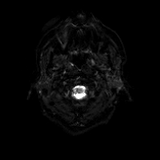
[im 21/84]
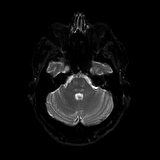
[im 42/84]
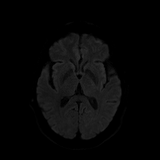
[im 63/84]
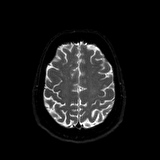
[im 84/84]
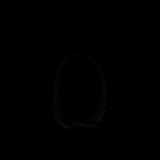

[Series 10: DWI · axial · 3.0mm · 1.44mm/px · z∈[-60,+75]mm · 3 of 42 slices shown (4 of 4)]
[im 1/42]
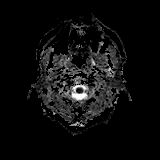
[im 21/42]
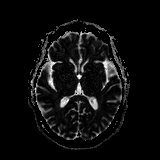
[im 42/42]
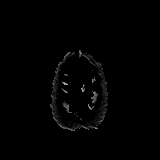

[Series 11: T2 · axial · 4.0mm · 0.36mm/px · z∈[-61,+74]mm · 2 of 27 slices shown]
[im 1/27]
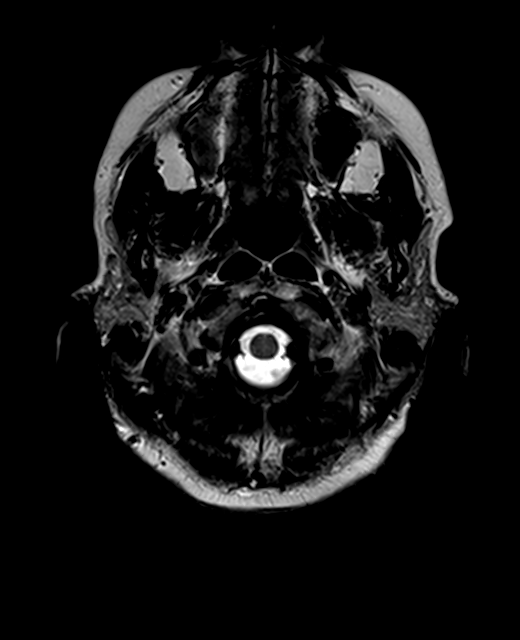
[im 27/27]
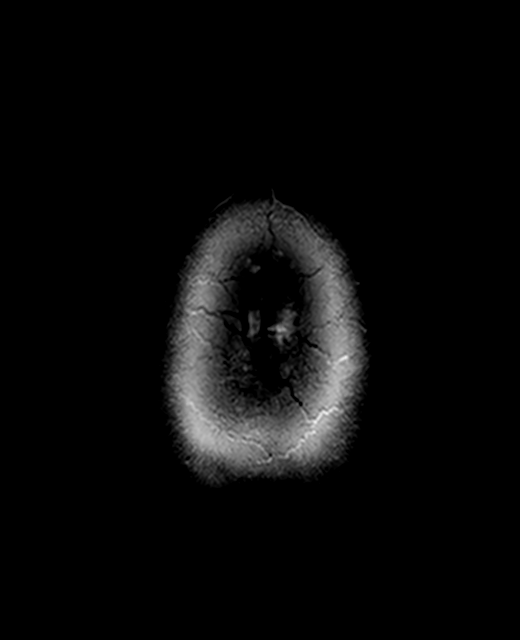

[Series 12: FLAIR · axial · 3.0mm · 0.72mm/px · z∈[-70,+80]mm · 2 of 26 slices shown (2 of 2)]
[im 1/26]
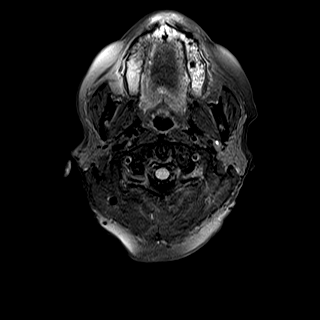
[im 26/26]
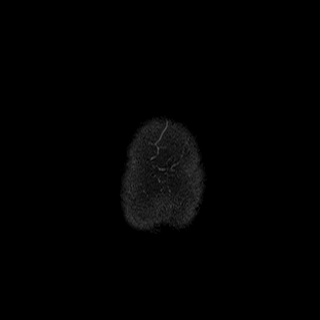

[Series 14: swi_images · axial · 1.5mm · 0.90mm/px · z∈[-64,+78]mm · 6 of 96 slices shown]
[im 1/96]
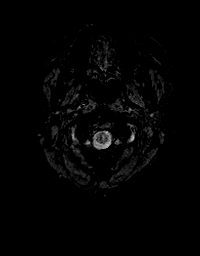
[im 20/96]
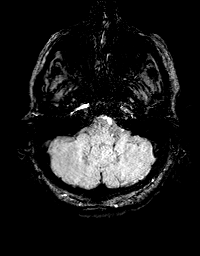
[im 39/96]
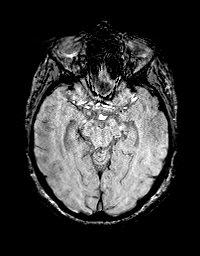
[im 58/96]
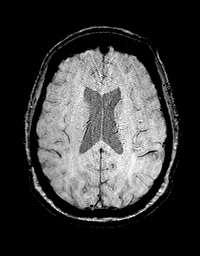
[im 77/96]
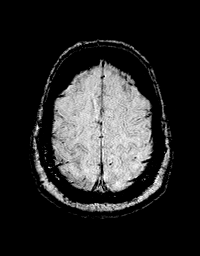
[im 96/96]
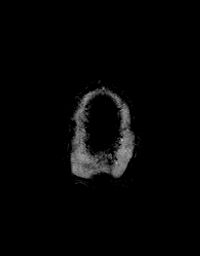

[Series 15: T1 · axial · 1.0mm · 0.90mm/px · z∈[-64,+78]mm · 9 of 144 slices shown (2 of 3)]
[im 1/144]
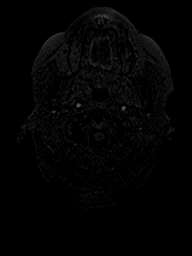
[im 18/144]
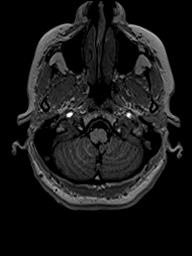
[im 36/144]
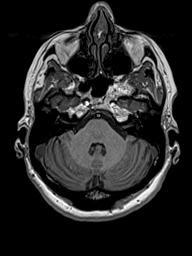
[im 54/144]
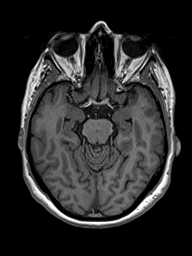
[im 72/144]
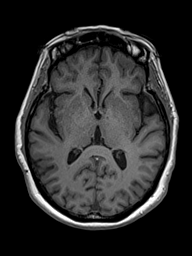
[im 90/144]
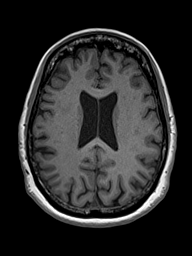
[im 108/144]
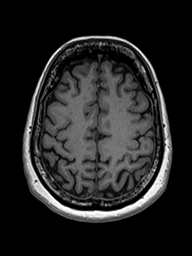
[im 126/144]
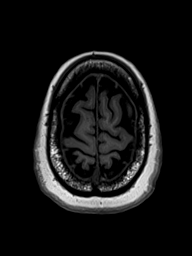
[im 144/144]
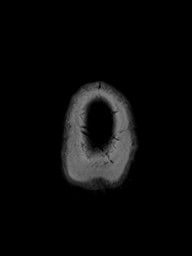

[Series 16: T2 post-contrast · coronal · 4.0mm · 0.36mm/px · 2 of 32 slices shown]
[im 1/32]
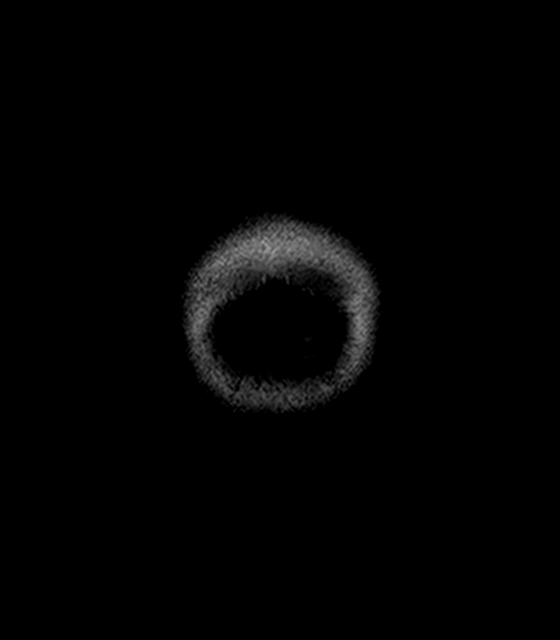
[im 32/32]
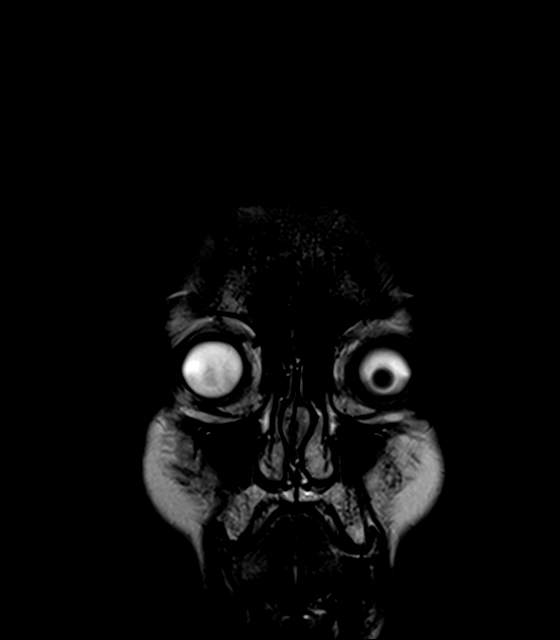

[Series 17: T1 · axial · 1.0mm · 0.90mm/px · z∈[-64,+78]mm · 9 of 144 slices shown (3 of 3)]
[im 1/144]
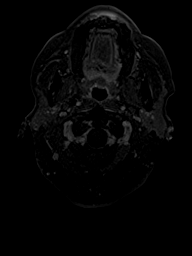
[im 18/144]
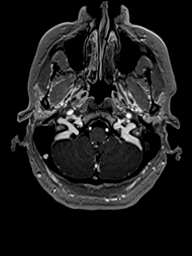
[im 36/144]
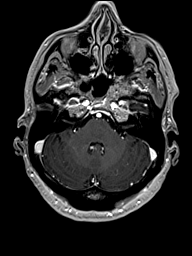
[im 54/144]
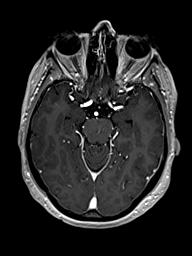
[im 72/144]
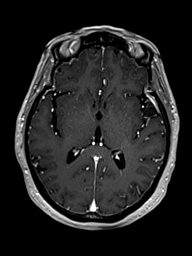
[im 90/144]
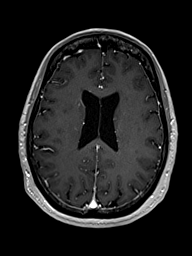
[im 108/144]
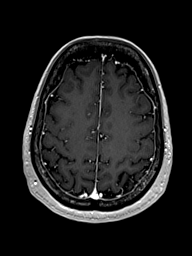
[im 126/144]
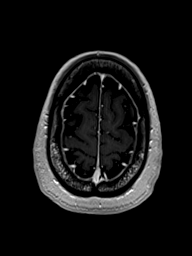
[im 144/144]
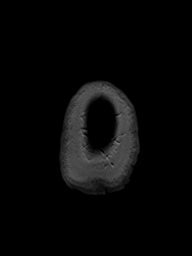

[Series 18: T1 post-contrast · coronal · 4.0mm · 0.72mm/px · 2 of 32 slices shown]
[im 1/32]
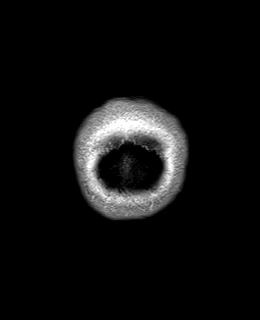
[im 32/32]
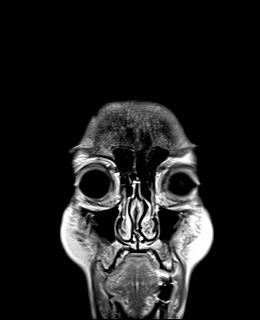

[48 of 48 positions shown; findings below may reference images not displayed]

FINDINGS: Brain: Ventricle size and cerebral volume normal. Mild patchy white
matter hyperintensities. Moderate hyperintensity in the pons
bilaterally. Negative for acute infarct. Negative for hemorrhage or
mass. Normal enhancement postcontrast administration.

Vascular: Normal arterial flow void

Skull and upper cervical spine: Negative

Sinuses/Orbits: Negative

Other: None
IMPRESSION: Chronic microvascular ischemic change in the white matter and pons.
No acute abnormality.
